# Patient Record
Sex: Male | Born: 1985 | Race: White | Hispanic: No | Marital: Single | Smoking: Never smoker
Health system: Southern US, Community
[De-identification: ages and names within clinical notes are randomized; demographics above are authoritative.]

## PROBLEM LIST (undated history)

## (undated) DIAGNOSIS — T4145XA Adverse effect of unspecified anesthetic, initial encounter: Secondary | ICD-10-CM

## (undated) DIAGNOSIS — Z8489 Family history of other specified conditions: Secondary | ICD-10-CM

## (undated) DIAGNOSIS — Z8619 Personal history of other infectious and parasitic diseases: Secondary | ICD-10-CM

## (undated) DIAGNOSIS — T8859XA Other complications of anesthesia, initial encounter: Secondary | ICD-10-CM

## (undated) DIAGNOSIS — F329 Major depressive disorder, single episode, unspecified: Secondary | ICD-10-CM

## (undated) DIAGNOSIS — F32A Depression, unspecified: Secondary | ICD-10-CM

## (undated) HISTORY — PX: APPENDECTOMY: SHX54

---
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
## 2008-03-08 ENCOUNTER — Emergency Department (HOSPITAL_COMMUNITY): Admission: EM | Admit: 2008-03-08 | Discharge: 2008-03-08 | Payer: Self-pay | Admitting: Emergency Medicine

## 2009-06-03 ENCOUNTER — Emergency Department (HOSPITAL_COMMUNITY): Admission: EM | Admit: 2009-06-03 | Discharge: 2009-06-03 | Payer: Self-pay | Admitting: Family Medicine

## 2010-12-19 ENCOUNTER — Inpatient Hospital Stay (INDEPENDENT_AMBULATORY_CARE_PROVIDER_SITE_OTHER)
Admission: RE | Admit: 2010-12-19 | Discharge: 2010-12-19 | Disposition: A | Discharge status: Home / Self Care | Payer: Managed Care, Other (non HMO) | Source: Ambulatory Visit | Attending: Emergency Medicine | Admitting: Emergency Medicine

## 2010-12-19 DIAGNOSIS — R6889 Other general symptoms and signs: Secondary | ICD-10-CM

## 2011-05-20 ENCOUNTER — Inpatient Hospital Stay (INDEPENDENT_AMBULATORY_CARE_PROVIDER_SITE_OTHER)
Admission: RE | Admit: 2011-05-20 | Discharge: 2011-05-20 | Disposition: A | Discharge status: Home / Self Care | Payer: Managed Care, Other (non HMO) | Source: Ambulatory Visit | Attending: Family Medicine | Admitting: Family Medicine

## 2011-05-20 DIAGNOSIS — B354 Tinea corporis: Secondary | ICD-10-CM

## 2013-12-18 ENCOUNTER — Emergency Department (HOSPITAL_COMMUNITY): Payer: 59 | Admitting: Anesthesiology

## 2013-12-18 ENCOUNTER — Encounter (HOSPITAL_COMMUNITY): Payer: 59 | Admitting: Anesthesiology

## 2013-12-18 ENCOUNTER — Encounter (HOSPITAL_COMMUNITY): Payer: Self-pay | Admitting: Emergency Medicine

## 2013-12-18 ENCOUNTER — Encounter (HOSPITAL_COMMUNITY): Admission: EM | Disposition: A | Discharge status: Home / Self Care | Payer: Self-pay | Source: Home / Self Care

## 2013-12-18 ENCOUNTER — Inpatient Hospital Stay (HOSPITAL_COMMUNITY)
Admission: EM | Admit: 2013-12-18 | Discharge: 2013-12-24 | DRG: 339 | Disposition: A | Discharge status: Home / Self Care | Payer: 59 | Attending: Surgery | Admitting: Surgery

## 2013-12-18 ENCOUNTER — Emergency Department (HOSPITAL_COMMUNITY): Payer: 59

## 2013-12-18 DIAGNOSIS — R1031 Right lower quadrant pain: Secondary | ICD-10-CM

## 2013-12-18 DIAGNOSIS — Z79899 Other long term (current) drug therapy: Secondary | ICD-10-CM

## 2013-12-18 DIAGNOSIS — L03319 Cellulitis of trunk, unspecified: Secondary | ICD-10-CM

## 2013-12-18 DIAGNOSIS — K35209 Acute appendicitis with generalized peritonitis, without abscess, unspecified as to perforation: Principal | ICD-10-CM | POA: Diagnosis present

## 2013-12-18 DIAGNOSIS — L02419 Cutaneous abscess of limb, unspecified: Secondary | ICD-10-CM | POA: Diagnosis present

## 2013-12-18 DIAGNOSIS — K3580 Unspecified acute appendicitis: Secondary | ICD-10-CM

## 2013-12-18 DIAGNOSIS — L02219 Cutaneous abscess of trunk, unspecified: Secondary | ICD-10-CM | POA: Diagnosis present

## 2013-12-18 DIAGNOSIS — L03119 Cellulitis of unspecified part of limb: Secondary | ICD-10-CM

## 2013-12-18 DIAGNOSIS — L0390 Cellulitis, unspecified: Secondary | ICD-10-CM

## 2013-12-18 DIAGNOSIS — K3532 Acute appendicitis with perforation and localized peritonitis, without abscess: Secondary | ICD-10-CM

## 2013-12-18 DIAGNOSIS — K352 Acute appendicitis with generalized peritonitis, without abscess: Principal | ICD-10-CM | POA: Diagnosis present

## 2013-12-18 DIAGNOSIS — R110 Nausea: Secondary | ICD-10-CM

## 2013-12-18 HISTORY — DX: Major depressive disorder, single episode, unspecified: F32.9

## 2013-12-18 HISTORY — DX: Adverse effect of unspecified anesthetic, initial encounter: T41.45XA

## 2013-12-18 HISTORY — DX: Other complications of anesthesia, initial encounter: T88.59XA

## 2013-12-18 HISTORY — DX: Depression, unspecified: F32.A

## 2013-12-18 HISTORY — PX: LAPAROSCOPIC APPENDECTOMY: SHX408

## 2013-12-18 HISTORY — DX: Family history of other specified conditions: Z84.89

## 2013-12-18 LAB — COMPREHENSIVE METABOLIC PANEL
ALT: 11 U/L (ref 0–53)
AST: 17 U/L (ref 0–37)
Albumin: 4.5 g/dL (ref 3.5–5.2)
Alkaline Phosphatase: 81 U/L (ref 39–117)
BUN: 8 mg/dL (ref 6–23)
CALCIUM: 9.8 mg/dL (ref 8.4–10.5)
CHLORIDE: 102 meq/L (ref 96–112)
CO2: 25 mEq/L (ref 19–32)
CREATININE: 0.86 mg/dL (ref 0.50–1.35)
Glucose, Bld: 81 mg/dL (ref 70–99)
POTASSIUM: 4 meq/L (ref 3.7–5.3)
Sodium: 138 mEq/L (ref 137–147)
Total Bilirubin: 1.4 mg/dL — ABNORMAL HIGH (ref 0.3–1.2)
Total Protein: 7.7 g/dL (ref 6.0–8.3)

## 2013-12-18 LAB — CBC WITH DIFFERENTIAL/PLATELET
BASOS ABS: 0 10*3/uL (ref 0.0–0.1)
Basophils Relative: 0 % (ref 0–1)
EOS ABS: 0 10*3/uL (ref 0.0–0.7)
Eosinophils Relative: 1 % (ref 0–5)
HEMATOCRIT: 44 % (ref 39.0–52.0)
HEMOGLOBIN: 15.9 g/dL (ref 13.0–17.0)
LYMPHS PCT: 14 % (ref 12–46)
Lymphs Abs: 1.1 10*3/uL (ref 0.7–4.0)
MCH: 31.5 pg (ref 26.0–34.0)
MCHC: 36.1 g/dL — AB (ref 30.0–36.0)
MCV: 87.3 fL (ref 78.0–100.0)
MONO ABS: 0.6 10*3/uL (ref 0.1–1.0)
MONOS PCT: 8 % (ref 3–12)
NEUTROS ABS: 6.2 10*3/uL (ref 1.7–7.7)
Neutrophils Relative %: 77 % (ref 43–77)
PLATELETS: 181 10*3/uL (ref 150–400)
RBC: 5.04 MIL/uL (ref 4.22–5.81)
RDW: 12.2 % (ref 11.5–15.5)
WBC: 7.9 10*3/uL (ref 4.0–10.5)

## 2013-12-18 LAB — URINALYSIS, ROUTINE W REFLEX MICROSCOPIC
BILIRUBIN URINE: NEGATIVE
GLUCOSE, UA: NEGATIVE mg/dL
HGB URINE DIPSTICK: NEGATIVE
Ketones, ur: NEGATIVE mg/dL
LEUKOCYTES UA: NEGATIVE
NITRITE: NEGATIVE
PROTEIN: NEGATIVE mg/dL
SPECIFIC GRAVITY, URINE: 1.022 (ref 1.005–1.030)
Urobilinogen, UA: 0.2 mg/dL (ref 0.0–1.0)
pH: 7.5 (ref 5.0–8.0)

## 2013-12-18 LAB — LIPASE, BLOOD: Lipase: 36 U/L (ref 11–59)

## 2013-12-18 SURGERY — APPENDECTOMY, LAPAROSCOPIC
Anesthesia: General

## 2013-12-18 MED ORDER — GLYCOPYRROLATE 0.2 MG/ML IJ SOLN
INTRAMUSCULAR | Status: DC | PRN
  Administered 2013-12-18: .5 mg via INTRAVENOUS

## 2013-12-18 MED ORDER — SUCCINYLCHOLINE CHLORIDE 20 MG/ML IJ SOLN
INTRAMUSCULAR | Status: AC
  Filled 2013-12-18: qty 1

## 2013-12-18 MED ORDER — OXYCODONE-ACETAMINOPHEN 5-325 MG PO TABS
1.0000 | ORAL_TABLET | ORAL | Status: DC | PRN
  Administered 2013-12-18 – 2013-12-22 (×17): 2 via ORAL
  Administered 2013-12-22: 1 via ORAL
  Administered 2013-12-22 – 2013-12-24 (×11): 2 via ORAL
  Filled 2013-12-18 (×6): qty 2
  Filled 2013-12-18: qty 1
  Filled 2013-12-18 (×15): qty 2
  Filled 2013-12-18: qty 1
  Filled 2013-12-18 (×8): qty 2

## 2013-12-18 MED ORDER — ROCURONIUM BROMIDE 50 MG/5ML IV SOLN
INTRAVENOUS | Status: AC
  Filled 2013-12-18: qty 1

## 2013-12-18 MED ORDER — ENOXAPARIN SODIUM 40 MG/0.4ML ~~LOC~~ SOLN
40.0000 mg | SUBCUTANEOUS | Status: DC
  Administered 2013-12-19 – 2013-12-24 (×6): 40 mg via SUBCUTANEOUS
  Filled 2013-12-18 (×11): qty 0.4

## 2013-12-18 MED ORDER — SODIUM CHLORIDE 0.9 % IR SOLN
Status: DC | PRN
  Administered 2013-12-18: 1000 mL

## 2013-12-18 MED ORDER — HYDROMORPHONE HCL PF 1 MG/ML IJ SOLN
0.2500 mg | INTRAMUSCULAR | Status: DC | PRN
  Administered 2013-12-18 (×4): 0.5 mg via INTRAVENOUS

## 2013-12-18 MED ORDER — FENTANYL CITRATE 0.05 MG/ML IJ SOLN
INTRAMUSCULAR | Status: DC | PRN
  Administered 2013-12-18: 50 ug via INTRAVENOUS
  Administered 2013-12-18 (×4): 100 ug via INTRAVENOUS
  Administered 2013-12-18: 50 ug via INTRAVENOUS

## 2013-12-18 MED ORDER — HYDROMORPHONE HCL PF 1 MG/ML IJ SOLN
1.0000 mg | INTRAMUSCULAR | Status: DC | PRN
  Administered 2013-12-18 – 2013-12-19 (×2): 1 mg via INTRAVENOUS
  Filled 2013-12-18 (×4): qty 1

## 2013-12-18 MED ORDER — ONDANSETRON HCL 4 MG/2ML IJ SOLN
4.0000 mg | Freq: Once | INTRAMUSCULAR | Status: AC
  Administered 2013-12-18: 4 mg via INTRAVENOUS
  Filled 2013-12-18: qty 2

## 2013-12-18 MED ORDER — VALACYCLOVIR HCL 500 MG PO TABS
500.0000 mg | ORAL_TABLET | ORAL | Status: DC
  Administered 2013-12-20 – 2013-12-24 (×2): 500 mg via ORAL
  Filled 2013-12-18 (×2): qty 1

## 2013-12-18 MED ORDER — ONDANSETRON HCL 4 MG/2ML IJ SOLN
INTRAMUSCULAR | Status: DC | PRN
  Administered 2013-12-18: 4 mg via INTRAVENOUS

## 2013-12-18 MED ORDER — PROPOFOL 10 MG/ML IV BOLUS
INTRAVENOUS | Status: AC
  Filled 2013-12-18: qty 20

## 2013-12-18 MED ORDER — SODIUM CHLORIDE 0.9 % IV SOLN
1.0000 g | INTRAVENOUS | Status: DC | PRN
  Administered 2013-12-18: 1 g via INTRAVENOUS

## 2013-12-18 MED ORDER — ERTAPENEM SODIUM 1 G IJ SOLR
1.0000 g | Freq: Once | INTRAMUSCULAR | Status: AC
  Filled 2013-12-18: qty 1

## 2013-12-18 MED ORDER — IOHEXOL 300 MG/ML  SOLN
100.0000 mL | Freq: Once | INTRAMUSCULAR | Status: AC | PRN
  Administered 2013-12-18: 100 mL via INTRAVENOUS

## 2013-12-18 MED ORDER — GLYCOPYRROLATE 0.2 MG/ML IJ SOLN
INTRAMUSCULAR | Status: AC
  Filled 2013-12-18: qty 1

## 2013-12-18 MED ORDER — MIDAZOLAM HCL 5 MG/5ML IJ SOLN
INTRAMUSCULAR | Status: DC | PRN
  Administered 2013-12-18: 2 mg via INTRAVENOUS

## 2013-12-18 MED ORDER — ONDANSETRON HCL 4 MG/2ML IJ SOLN
INTRAMUSCULAR | Status: AC
  Filled 2013-12-18: qty 2

## 2013-12-18 MED ORDER — FENTANYL CITRATE 0.05 MG/ML IJ SOLN
INTRAMUSCULAR | Status: AC
  Filled 2013-12-18: qty 5

## 2013-12-18 MED ORDER — SUCCINYLCHOLINE CHLORIDE 20 MG/ML IJ SOLN
INTRAMUSCULAR | Status: DC | PRN
  Administered 2013-12-18: 140 mg via INTRAVENOUS

## 2013-12-18 MED ORDER — BUPIVACAINE-EPINEPHRINE (PF) 0.25% -1:200000 IJ SOLN
INTRAMUSCULAR | Status: AC
  Filled 2013-12-18: qty 30

## 2013-12-18 MED ORDER — SODIUM CHLORIDE 0.9 % IV SOLN
1.0000 g | INTRAVENOUS | Status: DC
  Administered 2013-12-19 – 2013-12-21 (×3): 1 g via INTRAVENOUS
  Filled 2013-12-18 (×3): qty 1

## 2013-12-18 MED ORDER — ONDANSETRON HCL 4 MG/2ML IJ SOLN: 4.0000 mg | Freq: Four times a day (QID) | INTRAMUSCULAR | Status: DC | PRN

## 2013-12-18 MED ORDER — MIDAZOLAM HCL 2 MG/2ML IJ SOLN
INTRAMUSCULAR | Status: AC
Start: 2013-12-18 — End: 2013-12-18
  Administered 2013-12-18: 2 mg via INTRAVENOUS
  Filled 2013-12-18: qty 2

## 2013-12-18 MED ORDER — LIDOCAINE HCL (CARDIAC) 20 MG/ML IV SOLN
INTRAVENOUS | Status: AC
  Filled 2013-12-18: qty 5

## 2013-12-18 MED ORDER — ROCURONIUM BROMIDE 100 MG/10ML IV SOLN
INTRAVENOUS | Status: DC | PRN
  Administered 2013-12-18: 20 mg via INTRAVENOUS

## 2013-12-18 MED ORDER — BUPIVACAINE-EPINEPHRINE 0.25% -1:200000 IJ SOLN
INTRAMUSCULAR | Status: DC | PRN
  Administered 2013-12-18: 17 mL

## 2013-12-18 MED ORDER — ONDANSETRON HCL 4 MG PO TABS: 4.0000 mg | ORAL_TABLET | Freq: Four times a day (QID) | ORAL | Status: DC | PRN

## 2013-12-18 MED ORDER — OXYCODONE HCL 5 MG/5ML PO SOLN: 5.0000 mg | Freq: Once | ORAL | Status: DC | PRN

## 2013-12-18 MED ORDER — PROPOFOL 10 MG/ML IV BOLUS
INTRAVENOUS | Status: DC | PRN
  Administered 2013-12-18: 200 mg via INTRAVENOUS

## 2013-12-18 MED ORDER — MIDAZOLAM HCL 2 MG/2ML IJ SOLN
INTRAMUSCULAR | Status: AC
  Filled 2013-12-18: qty 2

## 2013-12-18 MED ORDER — OXYCODONE-ACETAMINOPHEN 5-325 MG PO TABS
ORAL_TABLET | ORAL | Status: AC
  Administered 2013-12-18: 2 via ORAL
  Filled 2013-12-18: qty 2

## 2013-12-18 MED ORDER — HYDROMORPHONE HCL PF 1 MG/ML IJ SOLN
INTRAMUSCULAR | Status: AC
  Administered 2013-12-18: 0.5 mg via INTRAVENOUS
  Filled 2013-12-18: qty 2

## 2013-12-18 MED ORDER — FENTANYL CITRATE 0.05 MG/ML IJ SOLN
50.0000 ug | Freq: Once | INTRAMUSCULAR | Status: AC
Start: 2013-12-18 — End: 2013-12-18
  Administered 2013-12-18: 50 ug via INTRAVENOUS
  Filled 2013-12-18: qty 2

## 2013-12-18 MED ORDER — MIDAZOLAM HCL 2 MG/2ML IJ SOLN
2.0000 mg | Freq: Once | INTRAMUSCULAR | Status: AC
  Administered 2013-12-18: 2 mg via INTRAVENOUS

## 2013-12-18 MED ORDER — FENTANYL CITRATE 0.05 MG/ML IJ SOLN
50.0000 ug | Freq: Once | INTRAMUSCULAR | Status: AC
  Administered 2013-12-18: 50 ug via INTRAVENOUS
  Filled 2013-12-18: qty 2

## 2013-12-18 MED ORDER — HYDROMORPHONE HCL PF 1 MG/ML IJ SOLN
1.0000 mg | Freq: Once | INTRAMUSCULAR | Status: AC
  Administered 2013-12-18: 1 mg via INTRAVENOUS
  Filled 2013-12-18: qty 1

## 2013-12-18 MED ORDER — NEOSTIGMINE METHYLSULFATE 10 MG/10ML IV SOLN
INTRAVENOUS | Status: AC
  Filled 2013-12-18: qty 1

## 2013-12-18 MED ORDER — NEOSTIGMINE METHYLSULFATE 10 MG/10ML IV SOLN
INTRAVENOUS | Status: DC | PRN
  Administered 2013-12-18: 3.5 mg via INTRAVENOUS

## 2013-12-18 MED ORDER — LACTATED RINGERS IV SOLN
INTRAVENOUS | Status: DC | PRN
  Administered 2013-12-18 (×2): via INTRAVENOUS

## 2013-12-18 MED ORDER — OXYCODONE HCL 5 MG PO TABS: 5.0000 mg | ORAL_TABLET | Freq: Once | ORAL | Status: DC | PRN

## 2013-12-18 MED ORDER — LIDOCAINE HCL (CARDIAC) 20 MG/ML IV SOLN
INTRAVENOUS | Status: DC | PRN
  Administered 2013-12-18: 50 mg via INTRAVENOUS

## 2013-12-18 MED ORDER — IOHEXOL 300 MG/ML  SOLN
25.0000 mL | INTRAMUSCULAR | Status: AC | PRN
  Administered 2013-12-18: 25 mL via ORAL

## 2013-12-18 MED ORDER — SODIUM CHLORIDE 0.9 % IV SOLN
3.0000 g | INTRAVENOUS | Status: AC
  Administered 2013-12-18: 3 g via INTRAVENOUS
  Filled 2013-12-18: qty 3

## 2013-12-18 SURGICAL SUPPLY — 44 items
APPLIER CLIP ROT 10 11.4 M/L (STAPLE)
BLADE SURG ROTATE 9660 (MISCELLANEOUS) IMPLANT
CANISTER SUCTION 2500CC (MISCELLANEOUS) ×3 IMPLANT
CHLORAPREP W/TINT 26ML (MISCELLANEOUS) ×3 IMPLANT
CLIP APPLIE ROT 10 11.4 M/L (STAPLE) IMPLANT
CLOSURE STERI-STRIP 1/2X4 (GAUZE/BANDAGES/DRESSINGS) ×1
CLSR STERI-STRIP ANTIMIC 1/2X4 (GAUZE/BANDAGES/DRESSINGS) ×2 IMPLANT
COVER SURGICAL LIGHT HANDLE (MISCELLANEOUS) ×3 IMPLANT
CUTTER LINEAR ENDO 35 ETS (STAPLE) ×3 IMPLANT
CUTTER LINEAR ENDO 35 ETS TH (STAPLE) IMPLANT
DECANTER SPIKE VIAL GLASS SM (MISCELLANEOUS) IMPLANT
DERMABOND ADVANCED (GAUZE/BANDAGES/DRESSINGS) ×2
DERMABOND ADVANCED .7 DNX12 (GAUZE/BANDAGES/DRESSINGS) ×1 IMPLANT
DRAPE UTILITY 15X26 W/TAPE STR (DRAPE) ×6 IMPLANT
DRSG TEGADERM 2-3/8X2-3/4 SM (GAUZE/BANDAGES/DRESSINGS) ×3 IMPLANT
ELECT REM PT RETURN 9FT ADLT (ELECTROSURGICAL) ×3
ELECTRODE REM PT RTRN 9FT ADLT (ELECTROSURGICAL) ×1 IMPLANT
ENDOLOOP SUT PDS II  0 18 (SUTURE)
ENDOLOOP SUT PDS II 0 18 (SUTURE) IMPLANT
FLUID NSS /IRRIG 3000 ML XXX (IV SOLUTION) ×3 IMPLANT
GLOVE BIOGEL PI IND STRL 8 (GLOVE) ×1 IMPLANT
GLOVE BIOGEL PI INDICATOR 8 (GLOVE) ×2
GLOVE ECLIPSE 7.5 STRL STRAW (GLOVE) ×3 IMPLANT
GOWN STRL REUS W/ TWL LRG LVL3 (GOWN DISPOSABLE) ×2 IMPLANT
GOWN STRL REUS W/TWL LRG LVL3 (GOWN DISPOSABLE) ×4
KIT BASIN OR (CUSTOM PROCEDURE TRAY) ×3 IMPLANT
KIT ROOM TURNOVER OR (KITS) ×3 IMPLANT
NS IRRIG 1000ML POUR BTL (IV SOLUTION) ×3 IMPLANT
PAD ARMBOARD 7.5X6 YLW CONV (MISCELLANEOUS) ×6 IMPLANT
PENCIL BUTTON HOLSTER BLD 10FT (ELECTRODE) IMPLANT
POUCH SPECIMEN RETRIEVAL 10MM (ENDOMECHANICALS) ×6 IMPLANT
RELOAD /EVU35 (ENDOMECHANICALS) IMPLANT
RELOAD CUTTER ETS 35MM STAND (ENDOMECHANICALS) ×3 IMPLANT
SET IRRIG TUBING LAPAROSCOPIC (IRRIGATION / IRRIGATOR) ×3 IMPLANT
SPECIMEN JAR SMALL (MISCELLANEOUS) ×3 IMPLANT
SUT MNCRL AB 4-0 PS2 18 (SUTURE) ×3 IMPLANT
TOWEL OR 17X24 6PK STRL BLUE (TOWEL DISPOSABLE) ×3 IMPLANT
TOWEL OR 17X26 10 PK STRL BLUE (TOWEL DISPOSABLE) ×3 IMPLANT
TRAY FOLEY CATH 16FR SILVER (SET/KITS/TRAYS/PACK) ×3 IMPLANT
TRAY LAPAROSCOPIC (CUSTOM PROCEDURE TRAY) ×3 IMPLANT
TROCAR XCEL 12X100 BLDLESS (ENDOMECHANICALS) ×3 IMPLANT
TROCAR XCEL BLUNT TIP 100MML (ENDOMECHANICALS) ×3 IMPLANT
TROCAR XCEL NON-BLD 5MMX100MML (ENDOMECHANICALS) ×3 IMPLANT
WATER STERILE IRR 1000ML POUR (IV SOLUTION) IMPLANT

## 2013-12-18 NOTE — ED Notes (Signed)
PT reports increase in pain. PA made aware.

## 2013-12-18 NOTE — ED Provider Notes (Signed)
CSN: [id number]     Arrival date & time 12/18/13  1132 History   First MD Initiated Contact with Patient 12/18/13 1153     Chief Complaint  Patient presents with  . Abdominal Pain     (Consider location/radiation/quality/duration/timing/severity/associated sxs/prior Treatment) HPI Dylan Parsons is a 28 y.o. male who presents to emergency department complaining of abdominal pain. Patient states he has had abdominal pain for 5 days now. It started as mild pain periumbilical. States now it is radiating up and down the right side of the abdomen. States he has had frequent soft stools that have been pale in color. He admits to nausea, denies any vomiting. He has had chills however denies any fever. He took ibuprofen for his pain with no improvement. He states that he is a daily alcohol drinker but states he only drinks one beer a day after work. 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 He states that his pain became much worse today, he states that he couldn't stand up straight due to pain, movement and walking makes pain worse. He went to urgent care where they examined him and sent him to emergency department for possible appendicitis.  History reviewed. No pertinent past medical history. History reviewed. No pertinent past surgical history. No family history on file. History  Substance Use Topics  . Smoking status: Never Smoker   . Smokeless tobacco: Not on file  . Alcohol Use: Yes     Comment: socially     Review of Systems  Constitutional: Positive for chills. Negative for fever.  Respiratory: Negative for cough, chest tightness and shortness of breath.   Cardiovascular: Negative for chest pain, palpitations and leg swelling.  Gastrointestinal: Positive for nausea and abdominal pain. Negative for vomiting, diarrhea and abdominal distention.  Genitourinary: Negative for dysuria, urgency, frequency and hematuria.  Musculoskeletal: Negative  for arthralgias, myalgias, neck pain and neck stiffness.  Skin: Negative for rash.  Allergic/Immunologic: Negative for immunocompromised state.  Neurological: Negative for dizziness, weakness, light-headedness, numbness and headaches.      Allergies  Review of patient's allergies indicates no known allergies.  Home Medications   Prior to Admission medications   Not on File   BP 127/87  Pulse 94  Temp(Src) 97.9 F (36.6 C) (Oral)  Resp 18  Ht 6' (1.829 m)  Wt 175 lb (79.379 kg)  BMI 23.73 kg/m2  SpO2 100% Physical Exam  Nursing note and vitals reviewed. Constitutional: He is oriented to person, place, and time. He appears well-developed and well-nourished. No distress.  HENT:  Head: Normocephalic and atraumatic.  Eyes: Conjunctivae are normal.  Neck: Neck supple.  Cardiovascular: Normal rate, regular rhythm and normal heart sounds.   Pulmonary/Chest: Effort normal. No respiratory distress. He has no wheezes. He has no rales.  Abdominal: Soft. Bowel sounds are normal. He exhibits no distension. There is tenderness. There is no rebound.  RLQ tenderness, guarding, LLQ tenderness  Musculoskeletal: He exhibits no edema.  Neurological: He is alert and oriented to person, place, and time.  Skin: Skin is warm and dry.    ED Course  Procedures (including critical care time) Labs Review Labs Reviewed  CBC WITH DIFFERENTIAL - Abnormal; Notable for the following:    MCHC 36.1 (*)    All other components within normal limits  COMPREHENSIVE METABOLIC PANEL - Abnormal; Notable for the following:    Total Bilirubin 1.4 (*)    All other components within normal limits  LIPASE, BLOOD  URINALYSIS, ROUTINE W REFLEX MICROSCOPIC    Imaging Review Ct Abdomen Pelvis W Contrast  12/18/2013   CLINICAL DATA:  Mid abdominal pain  EXAM: CT ABDOMEN AND PELVIS WITH CONTRAST  TECHNIQUE: Multidetector CT imaging of the abdomen and pelvis was performed using the standard protocol following  bolus administration of intravenous contrast.  CONTRAST:  OMNIPAQUE IOHEXOL 300 MG/ML SOLN, 90mL OMNIPAQUE IOHEXOL 300 MG/ML SOLN  COMPARISON:  None.  FINDINGS: The lung bases are free of acute infiltrate or sizable effusion.  The liver, gallbladder, spleen, adrenal glands and pancreas are all normal in their CT appearance. Kidneys are well visualized bilaterally. No renal calculi or urinary tract obstructive changes are seen.  In the right lower quadrant there is considerable inflammatory change surrounding the dilated appendix. Appendicolith is noted within the appendix. It is maximally dilated to 2 cm. No free pelvic fluid is noted. The bladder is well distended. No acute bony abnormality is seen.  IMPRESSION: Changes consistent with acute appendicitis   Electronically Signed   By: Alcide Clever M.D.   On: 12/18/2013 13:19     EKG Interpretation None      MDM   Final diagnoses:  Acute appendicitis    Pt with diffuse abdominal pain with significant tenderness in RLQ. Pt already received fentanyl per RN protochol and he is feeling much better. Will get labs, UA, CT abd/pelvis  1:39 PM Labs normal. CT showing changes consistent with acute appendicitis. Spoke with Dr. Lindie Spruce, will come see pt. Pt notified of findings. Pain is currently controlled.   Filed Vitals:   12/18/13 1200 12/18/13 1230 12/18/13 1315 12/18/13 1338  BP: 138/91 146/78 122/65 118/62  Pulse: 88 77 87 88  Temp:      TempSrc:      Resp: 18 18 18 18   Height:      Weight:      SpO2: 95% 100% 99% 98%     Lottie Mussel, PA-C 12/19/13 2047

## 2013-12-18 NOTE — ED Notes (Signed)
Dr. Lindie Spruce at bedside; consent for surgery signed.

## 2013-12-18 NOTE — ED Notes (Signed)
Patient returned from CT

## 2013-12-18 NOTE — Anesthesia Preprocedure Evaluation (Addendum)
Anesthesia Evaluation  Patient identified by MRN, date of birth, ID band Patient awake    Reviewed: Allergy & Precautions, H&P , NPO status , Patient's Chart, lab work & pertinent test results  History of Anesthesia Complications Negative for: history of anesthetic complications  Airway Mallampati: I TM Distance: >3 FB Neck ROM: Full    Dental no notable dental hx. (+) Teeth Intact, Dental Advisory Given   Pulmonary neg pulmonary ROS,    Pulmonary exam normal       Cardiovascular negative cardio ROS      Neuro/Psych negative neurological ROS  negative psych ROS   GI/Hepatic negative GI ROS, Neg liver ROS,   Endo/Other  negative endocrine ROS  Renal/GU negative Renal ROS  negative genitourinary   Musculoskeletal   Abdominal   Peds  Hematology negative hematology ROS (+)   Anesthesia Other Findings   Reproductive/Obstetrics negative OB ROS                         Anesthesia Physical Anesthesia Plan  ASA: I and emergent  Anesthesia Plan: General   Post-op Pain Management:    Induction: Intravenous, Rapid sequence and Cricoid pressure planned  Airway Management Planned: Oral ETT  Additional Equipment:   Intra-op Plan:   Post-operative Plan: Extubation in OR  Informed Consent: I have reviewed the patients History and Physical, chart, labs and discussed the procedure including the risks, benefits and alternatives for the proposed anesthesia with the patient or authorized representative who has indicated his/her understanding and acceptance.   Dental advisory given  Plan Discussed with: CRNA, Anesthesiologist and Surgeon  Anesthesia Plan Comments:        Anesthesia Quick Evaluation

## 2013-12-18 NOTE — Anesthesia Postprocedure Evaluation (Signed)
  Anesthesia Post-op Note  Patient: Dylan Parsons  Procedure(s) Performed: Procedure(s): APPENDECTOMY LAPAROSCOPIC (N/A)  Patient Location: PACU  Anesthesia Type:General  Level of Consciousness: awake and alert   Airway and Oxygen Therapy: Patient Spontanous Breathing  Post-op Pain: mild  Post-op Assessment: Post-op Vital signs reviewed, Patient's Cardiovascular Status Stable and Respiratory Function Stable  Post-op Vital Signs: Reviewed  Filed Vitals:   12/18/13 1830  BP:   Pulse:   Temp: 37.2 C  Resp:     Complications: No apparent anesthesia complications

## 2013-12-18 NOTE — Progress Notes (Signed)
1 bag of belongings taken to PACU. 

## 2013-12-18 NOTE — Anesthesia Procedure Notes (Signed)
Procedure Name: Intubation Date/Time: 12/18/2013 3:54 PM Performed by: Estie Sproule S Pre-anesthesia Checklist: Patient identified, Timeout performed, Emergency Drugs available, Suction available and Patient being monitored Patient Re-evaluated:Patient Re-evaluated prior to inductionOxygen Delivery Method: Circle system utilized Preoxygenation: Pre-oxygenation with 100% oxygen Intubation Type: IV induction, Rapid sequence and Cricoid Pressure applied Ventilation: Mask ventilation without difficulty Laryngoscope Size: Mac and 4 Grade View: Grade I Tube type: Oral Tube size: 7.5 mm Number of attempts: 1 Airway Equipment and Method: Stylet Placement Confirmation: ETT inserted through vocal cords under direct vision,  positive ETCO2 and breath sounds checked- equal and bilateral Secured at: 22 cm Tube secured with: Tape Dental Injury: Teeth and Oropharynx as per pre-operative assessment

## 2013-12-18 NOTE — ED Notes (Signed)
Patient finished oral contrast; CT still at bedside and is aware.

## 2013-12-18 NOTE — ED Notes (Signed)
Patient transported to CT 

## 2013-12-18 NOTE — Transfer of Care (Signed)
Immediate Anesthesia Transfer of Care Note  Patient: Dylan Parsons  Procedure(s) Performed: Procedure(s): APPENDECTOMY LAPAROSCOPIC (N/A)  Patient Location: PACU  Anesthesia Type:General  Level of Consciousness: awake, alert  and oriented  Airway & Oxygen Therapy: Patient Spontanous Breathing and Patient connected to nasal cannula oxygen  Post-op Assessment: Report given to PACU RN and Post -op Vital signs reviewed and stable  Post vital signs: Reviewed and stable  Complications: No apparent anesthesia complications

## 2013-12-18 NOTE — H&P (Signed)
Dylan Parsons is an 28 y.o. male.   Chief Complaint: Abdominal pain and acute appendicitis. HPI: Patient actually had this pain one year ago, more severe, but went away on its own.  This last episode started Tuesday, worsened by Thursday, came to Urgent Care today and was ssent to the ED.  Dx of acute appendicitis clinically and radiologically.  History reviewed. No pertinent past medical history.  History reviewed. No pertinent past surgical history.  No family history on file. Social History:  reports that he has never smoked. He does not have any smokeless tobacco history on file. He reports that he drinks alcohol. He reports that he does not use illicit drugs.  Allergies: No Known Allergies   (Not in a hospital admission)  Results for orders placed during the hospital encounter of 12/18/13 (from the past 48 hour(s))  CBC WITH DIFFERENTIAL     Status: Abnormal   Collection Time    12/18/13 11:48 AM      Result Value Ref Range   WBC 7.9  4.0 - 10.5 K/uL   RBC 5.04  4.22 - 5.81 MIL/uL   Hemoglobin 15.9  13.0 - 17.0 g/dL   HCT 44.0  39.0 - 52.0 %   MCV 87.3  78.0 - 100.0 fL   MCH 31.5  26.0 - 34.0 pg   MCHC 36.1 (*) 30.0 - 36.0 g/dL   RDW 12.2  11.5 - 15.5 %   Platelets 181  150 - 400 K/uL   Neutrophils Relative % 77  43 - 77 %   Neutro Abs 6.2  1.7 - 7.7 K/uL   Lymphocytes Relative 14  12 - 46 %   Lymphs Abs 1.1  0.7 - 4.0 K/uL   Monocytes Relative 8  3 - 12 %   Monocytes Absolute 0.6  0.1 - 1.0 K/uL   Eosinophils Relative 1  0 - 5 %   Eosinophils Absolute 0.0  0.0 - 0.7 K/uL   Basophils Relative 0  0 - 1 %   Basophils Absolute 0.0  0.0 - 0.1 K/uL  COMPREHENSIVE METABOLIC PANEL     Status: Abnormal   Collection Time    12/18/13 11:48 AM      Result Value Ref Range   Sodium 138  137 - 147 mEq/L   Potassium 4.0  3.7 - 5.3 mEq/L   Chloride 102  96 - 112 mEq/L   CO2 25  19 - 32 mEq/L   Glucose, Bld 81  70 - 99 mg/dL   BUN 8  6 - 23 mg/dL   Creatinine, Ser 0.86  0.50 -  1.35 mg/dL   Calcium 9.8  8.4 - 10.5 mg/dL   Total Protein 7.7  6.0 - 8.3 g/dL   Albumin 4.5  3.5 - 5.2 g/dL   AST 17  0 - 37 U/L   ALT 11  0 - 53 U/L   Alkaline Phosphatase 81  39 - 117 U/L   Total Bilirubin 1.4 (*) 0.3 - 1.2 mg/dL   GFR calc non Af Amer >90  >90 mL/min   GFR calc Af Amer >90  >90 mL/min   Comment: (NOTE)     The eGFR has been calculated using the CKD EPI equation.     This calculation has not been validated in all clinical situations.     eGFR's persistently <90 mL/min signify possible Chronic Kidney     Disease.  LIPASE, BLOOD     Status: None   Collection  Time    12/18/13 11:48 AM      Result Value Ref Range   Lipase 36  11 - 59 U/L  URINALYSIS, ROUTINE W REFLEX MICROSCOPIC     Status: None   Collection Time    12/18/13 12:17 PM      Result Value Ref Range   Color, Urine YELLOW  YELLOW   APPearance CLEAR  CLEAR   Specific Gravity, Urine 1.022  1.005 - 1.030   pH 7.5  5.0 - 8.0   Glucose, UA NEGATIVE  NEGATIVE mg/dL   Hgb urine dipstick NEGATIVE  NEGATIVE   Bilirubin Urine NEGATIVE  NEGATIVE   Ketones, ur NEGATIVE  NEGATIVE mg/dL   Protein, ur NEGATIVE  NEGATIVE mg/dL   Urobilinogen, UA 0.2  0.0 - 1.0 mg/dL   Nitrite NEGATIVE  NEGATIVE   Leukocytes, UA NEGATIVE  NEGATIVE   Comment: MICROSCOPIC NOT DONE ON URINES WITH NEGATIVE PROTEIN, BLOOD, LEUKOCYTES, NITRITE, OR GLUCOSE <1000 mg/dL.   Ct Abdomen Pelvis W Contrast  12/18/2013   CLINICAL DATA:  Mid abdominal pain  EXAM: CT ABDOMEN AND PELVIS WITH CONTRAST  TECHNIQUE: Multidetector CT imaging of the abdomen and pelvis was performed using the standard protocol following bolus administration of intravenous contrast.  CONTRAST:  150m OMNIPAQUE IOHEXOL 300 MG/ML SOLN, 227mOMNIPAQUE IOHEXOL 300 MG/ML SOLN  COMPARISON:  None.  FINDINGS: The lung bases are free of acute infiltrate or sizable effusion.  The liver, gallbladder, spleen, adrenal glands and pancreas are all normal in their CT appearance. Kidneys are  well visualized bilaterally. No renal calculi or urinary tract obstructive changes are seen.  In the right lower quadrant there is considerable inflammatory change surrounding the dilated appendix. Appendicolith is noted within the appendix. It is maximally dilated to 2 cm. No free pelvic fluid is noted. The bladder is well distended. No acute bony abnormality is seen.  IMPRESSION: Changes consistent with acute appendicitis   Electronically Signed   By: MaInez Catalina.D.   On: 12/18/2013 13:19    Review of Systems  Constitutional: Positive for chills. Negative for fever.  Gastrointestinal: Positive for nausea and abdominal pain.  All other systems reviewed and are negative.   Blood pressure 126/70, pulse 77, temperature 97.9 F (36.6 C), temperature source Oral, resp. rate 18, height 6' (1.829 m), weight 79.379 kg (175 lb), SpO2 97.00%. Physical Exam  Constitutional: He is oriented to person, place, and time. He appears well-developed and well-nourished.  HENT:  Head: Normocephalic and atraumatic.  Right Ear: External ear normal.  Left Ear: External ear normal.  Eyes: Conjunctivae are normal. Pupils are equal, round, and reactive to light.  Neck: Normal range of motion.  Cardiovascular: Normal rate, regular rhythm and normal heart sounds.   Respiratory: Effort normal and breath sounds normal.  GI: Soft. Normal appearance. There is tenderness in the right lower quadrant and periumbilical area. There is rebound, guarding and tenderness at McBurney's point.  Musculoskeletal: Normal range of motion.  Neurological: He is alert and oriented to person, place, and time.  Skin: Skin is warm and dry.  Psychiatric: He has a normal mood and affect. His behavior is normal. Judgment and thought content normal.     Assessment/Plan Acute appendicitis  Unasyn Preop OR for lap appy.   Risks and benefits explained to the patient.  Will take to the OR ASAP.  JaGwenyth Ober/30/2015, 2:27 PM

## 2013-12-18 NOTE — ED Notes (Signed)
Pt reports mid abdominal pain x 5 days with nausea. Denies diarrhea, or emesis. States pain worse with movement. Denies urinary symptoms.

## 2013-12-18 NOTE — ED Notes (Signed)
Family has not arrived yet; security at bedside locking up valuables consisting of phone, keys, and wallet.

## 2013-12-18 NOTE — Op Note (Signed)
OPERATIVE REPORT  DATE OF OPERATION: 12/18/2013  PATIENT:  Dylan Parsons  28 y.o. male  PRE-OPERATIVE DIAGNOSIS:  Acute appendicitis  POST-OPERATIVE DIAGNOSIS:  Acute appendicitis with rupture  PROCEDURE:  Procedure(s): APPENDECTOMY LAPAROSCOPIC  SURGEON:  Surgeon(s): Cherylynn Ridges, MD  ASSISTANT: None  ANESTHESIA:   general  EBL: <30 ml  BLOOD ADMINISTERED: none  DRAINS: none   SPECIMEN:  Source of Specimen:  Appendix  COUNTS CORRECT:  YES  PROCEDURE DETAILS: The patient was taken to the operating room and placed on the table in the supine position. After an adequate general endotracheal anesthetic was administered he was prepped and draped in the usual sterile manner exposing its entire abdomen.  A proper timeout was performed identifying the patient and the procedure to be performed. A supraumbilical midline incision was made using a #15 blade. This took [place] down to the midline fascia which was incised with a #15 blade. We bluntly dissected down into the peritoneal cavity then passed a pursestring suture of 0 Vicryl around the fascial opening. This secured in a Hassan cannula which was subsequently passed into the peritoneal cavity.  Carbon dioxide gas was insufflated into the peritoneal cavity up to a maximal intra-abdominal pressure of 15 mm mercury through the [facility] cannula. We subsequently passed a right upper quadrant 5 mm cannula and a left low quadrant 12 mm cannula under direct vision. The patient was placed in Trendelenburg position and the left eye was tilted down.  Be acutely inflamed and markedly distended appendix could be noted partially walled off by the terminal ileum and the mesentery in the right low quadrant. We bluntly dissected the appendix away from the terminal ileum and as we were doing so it ruptured spilling purulent contents into the right low quadrant. This was subsequently aspirated away with over 4 L of warm saline irrigation. However in the  process we had to mobilize the appendix up towards the right upper quadrant and noted to be at the base. In the process the midportion of the ruptured appendix involves from the proximal portion we subsequently removed bed independently. The mesentery was taken with an Endo GIA stapler.  We subsequently retrieved the distal portion of the tendon using an Endo Catch bag we then had to go back in and dissect out the base of the appendix and the complete proximal half of the starting at the base of the cecum and isolate and not the mesoappendix using a Harmonic Scalpel.  We came across the base of the cecum again using a blue cartridge Endo GIA. We subsequently removed the remainder of the appendix from the cannula site in the left low quadrant. We then obtain adequate hemostasis using electrocautery and irrigation brought to ascertain whether not there was any leakage or damage. There was no damage noted and no leakage. We aspirated all fluid and gas from above the liver and around the peritoneal cavity using the aspirator.  The supraumbilical fascial site was closed using the pursestring suture which was in place. We subsequently again aspirated all fluid and gas are remove all cannulas. We injected quarter percent Marcaine at all sites. The skin was then closed using a running subcuticular stitch of 4-0 Monocryl, then Dermabond, the Steri-Strips and Tegaderm.  All needle counts, sponge counts, and instrument counts were correct.  PATIENT DISPOSITION:  PACU - hemodynamically stable.   Cherylynn Ridges 5/30/20155:29 PM

## 2013-12-18 NOTE — Progress Notes (Signed)
Pt is extremely anxious and is having significant pain. Anesthesia at bedside. Versed given as ordered. Pt is now resting quietly and says pain is "much better"

## 2013-12-19 NOTE — Progress Notes (Signed)
Patient ID: Dylan Parsons, male   DOB: 03-29-1986, 28 y.o.   MRN: [id number] [facility] Progress Note:   1 Day Post-Op  Subjective: Mental status is clear; sitting up in chair in the bathroom.  No flatus yet Objective: Vital signs in last 24 hours: Temp:  [97.9 F (36.6 C)-99.4 F (37.4 C)] 98.2 F (36.8 C) (05/31 0510) Pulse Rate:  [73-117] 107 (05/31 0510) Resp:  [12-23] 15 (05/31 0510) BP: (118-176)/(62-99) 118/64 mmHg (05/31 0510) SpO2:  [93 %-100 %] 98 % (05/31 0510) Weight:  [175 lb (79.379 kg)] 175 lb (79.379 kg) (05/30 1140)  Intake/Output from previous day: 05/30 0701 - 05/31 0700 In: 1650 [I.V.:1650] Out: 125 [Urine:100; Blood:25] Intake/Output this shift:    Physical Exam: Work of breathing is not labored.  Incisions clean and covered with Dermabond.    Lab Results:  Results for orders placed during the hospital encounter of 12/18/13 (from the past 48 hour(s))  CBC WITH DIFFERENTIAL     Status: Abnormal   Collection Time    12/18/13 11:48 AM      Result Value Ref Range   WBC 7.9  4.0 - 10.5 K/uL   RBC 5.04  4.22 - 5.81 MIL/uL   Hemoglobin 15.9  13.0 - 17.0 g/dL   HCT 44.0  39.0 - 52.0 %   MCV 87.3  78.0 - 100.0 fL   MCH 31.5  26.0 - 34.0 pg   MCHC 36.1 (*) 30.0 - 36.0 g/dL   RDW 12.2  11.5 - 15.5 %   Platelets 181  150 - 400 K/uL   Neutrophils Relative % 77  43 - 77 %   Neutro Abs 6.2  1.7 - 7.7 K/uL   Lymphocytes Relative 14  12 - 46 %   Lymphs Abs 1.1  0.7 - 4.0 K/uL   Monocytes Relative 8  3 - 12 %   Monocytes Absolute 0.6  0.1 - 1.0 K/uL   Eosinophils Relative 1  0 - 5 %   Eosinophils Absolute 0.0  0.0 - 0.7 K/uL   Basophils Relative 0  0 - 1 %   Basophils Absolute 0.0  0.0 - 0.1 K/uL  COMPREHENSIVE METABOLIC PANEL     Status: Abnormal   Collection Time    12/18/13 11:48 AM      Result Value Ref Range   Sodium 138  137 - 147 mEq/L   Potassium 4.0  3.7 - 5.3 mEq/L   Chloride 102  96 - 112 mEq/L   CO2 25  19 - 32 mEq/L   Glucose, Bld  81  70 - 99 mg/dL   BUN 8  6 - 23 mg/dL   Creatinine, Ser 0.86  0.50 - 1.35 mg/dL   Calcium 9.8  8.4 - 10.5 mg/dL   Total Protein 7.7  6.0 - 8.3 g/dL   Albumin 4.5  3.5 - 5.2 g/dL   AST 17  0 - 37 U/L   ALT 11  0 - 53 U/L   Alkaline Phosphatase 81  39 - 117 U/L   Total Bilirubin 1.4 (*) 0.3 - 1.2 mg/dL   GFR calc non Af Amer >90  >90 mL/min   GFR calc Af Amer >90  >90 mL/min   Comment: (NOTE)     The eGFR has been calculated using the CKD EPI equation.     This calculation has not been validated in all clinical situations.     eGFR's persistently <90 mL/min signify possible Chronic  Kidney     Disease.  LIPASE, BLOOD     Status: None   Collection Time    12/18/13 11:48 AM      Result Value Ref Range   Lipase 36  11 - 59 U/L  URINALYSIS, ROUTINE W REFLEX MICROSCOPIC     Status: None   Collection Time    12/18/13 12:17 PM      Result Value Ref Range   Color, Urine YELLOW  YELLOW   APPearance CLEAR  CLEAR   Specific Gravity, Urine 1.022  1.005 - 1.030   pH 7.5  5.0 - 8.0   Glucose, UA NEGATIVE  NEGATIVE mg/dL   Hgb urine dipstick NEGATIVE  NEGATIVE   Bilirubin Urine NEGATIVE  NEGATIVE   Ketones, ur NEGATIVE  NEGATIVE mg/dL   Protein, ur NEGATIVE  NEGATIVE mg/dL   Urobilinogen, UA 0.2  0.0 - 1.0 mg/dL   Nitrite NEGATIVE  NEGATIVE   Leukocytes, UA NEGATIVE  NEGATIVE   Comment: MICROSCOPIC NOT DONE ON URINES WITH NEGATIVE PROTEIN, BLOOD, LEUKOCYTES, NITRITE, OR GLUCOSE <1000 mg/dL.    Radiology/Results: Ct Abdomen Pelvis W Contrast  12/18/2013   CLINICAL DATA:  Mid abdominal pain  EXAM: CT ABDOMEN AND PELVIS WITH CONTRAST  TECHNIQUE: Multidetector CT imaging of the abdomen and pelvis was performed using the standard protocol following bolus administration of intravenous contrast.  CONTRAST:  148m OMNIPAQUE IOHEXOL 300 MG/ML SOLN, 295mOMNIPAQUE IOHEXOL 300 MG/ML SOLN  COMPARISON:  None.  FINDINGS: The lung bases are free of acute infiltrate or sizable effusion.  The liver,  gallbladder, spleen, adrenal glands and pancreas are all normal in their CT appearance. Kidneys are well visualized bilaterally. No renal calculi or urinary tract obstructive changes are seen.  In the right lower quadrant there is considerable inflammatory change surrounding the dilated appendix. Appendicolith is noted within the appendix. It is maximally dilated to 2 cm. No free pelvic fluid is noted. The bladder is well distended. No acute bony abnormality is seen.  IMPRESSION: Changes consistent with acute appendicitis   Electronically Signed   By: MaInez Catalina.D.   On: 12/18/2013 13:19    Anti-infectives: Anti-infectives   Start     Dose/Rate Route Frequency Ordered Stop   12/20/13 1000  valACYclovir (VALTREX) tablet 500 mg     500 mg Oral Once per day on Mon Fri 12/18/13 1909     12/19/13 1800  ertapenem (INVANZ) 1 g in sodium chloride 0.9 % 50 mL IVPB     1 g 100 mL/hr over 30 Minutes Intravenous Every 24 hours 12/18/13 1909     12/19/13 0600  [MAR Hold]  Ampicillin-Sulbactam (UNASYN) 3 g in sodium chloride 0.9 % 100 mL IVPB     (On MAR Hold since 12/18/13 1539)   3 g 100 mL/hr over 60 Minutes Intravenous On call to O.R. 12/18/13 1426 12/18/13 1550   12/18/13 1715  ertapenem (INVANZ) 1 g in sodium chloride 0.9 % 50 mL IVPB     1 g 100 mL/hr over 30 Minutes Intravenous  Once 12/18/13 1710 12/18/13 1941      Assessment/Plan: Problem List: Patient Active Problem List   Diagnosis Date Noted  . Acute appendicitis with rupture 12/18/2013    Stable postop Offer clears po 1 Day Post-Op    LOS: 1 day   Matt B. MaHassell DoneMD, FASpectrum Health [facility], [facility] [phone] [phone]/31/2015 8:20 AM

## 2013-12-20 ENCOUNTER — Encounter (HOSPITAL_COMMUNITY): Payer: Self-pay | Admitting: General Surgery

## 2013-12-20 MED ORDER — VANCOMYCIN HCL 10 G IV SOLR
1500.0000 mg | Freq: Two times a day (BID) | INTRAVENOUS | Status: DC
  Administered 2013-12-20 – 2013-12-22 (×5): 1500 mg via INTRAVENOUS
  Filled 2013-12-20 (×8): qty 1500

## 2013-12-20 NOTE — ED Provider Notes (Signed)
Medical screening examination/treatment/procedure(s) were performed by non-physician practitioner and as supervising physician I was immediately available for consultation/collaboration.   EKG Interpretation None       Flint Melter, MD 12/20/13 1109

## 2013-12-20 NOTE — Progress Notes (Signed)
ANTIBIOTIC CONSULT NOTE - INITIAL  Pharmacy Consult for Vancomycin Indication: Cellulitis  No Known Allergies  Patient Measurements: Height: 6' (182.9 cm) Weight: 175 lb (79.379 kg) IBW/kg (Calculated) : 77.6 Adjusted Body Weight:    Vital Signs: Temp: 98.2 F (36.8 C) (06/01 0524) Temp src: Oral (05/31 2100) BP: 118/74 mmHg (06/01 0524) Pulse Rate: 103 (06/01 0524) Intake/Output from previous day:   Intake/Output from this shift:    Labs:  Recent Labs  12/18/13 1148  WBC 7.9  HGB 15.9  PLT 181  CREATININE 0.86   Estimated Creatinine Clearance: 142.9 ml/min (by C-G formula based on Cr of 0.86). No results found for this basename: VANCOTROUGH, VANCOPEAK, VANCORANDOM, GENTTROUGH, GENTPEAK, GENTRANDOM, TOBRATROUGH, TOBRAPEAK, TOBRARND, AMIKACINPEAK, AMIKACINTROU, AMIKACIN,  in the last 72 hours   Microbiology: No results found for this or any previous visit (from the past 720 hour(s)).  Medical History: History reviewed. No pertinent past medical history.  Medications:  Prescriptions prior to admission  Medication Sig Dispense Refill  . ibuprofen (ADVIL,MOTRIN) 200 MG tablet Take 800 mg by mouth every 6 (six) hours as needed.      . valACYclovir (VALTREX) 500 MG tablet Take 500 mg by mouth 2 (two) times a week. Monday and Friday       Assessment: s/p lap appendectomy for perforated appendix.  ID: Extensive area of cellulitis of his LLQ incision extending down his groin and posterior into his back and hip that is tender. Tmax 99.1. WBC 7.9.  Invanz 5/30>> Vanco 6/1>> Valtrex from PTA>>   Goal of Therapy:  Vancomycin trough level 10-15 mcg/ml  Plan:  Vancomycin 1500mg  IV q12h Check trough after 3-5 doses at steady state.  Danijela Vessey S. Merilynn Finland, PharmD, [facility] Clinical Staff Pharmacist Pager [phone]  [facility] Merilynn Finland 12/20/2013,8:59 AM

## 2013-12-20 NOTE — Progress Notes (Signed)
I have seen and examined the patient and agree with the assessment and plans.  Vergil Burby A. Wendee Hata  MD, FACS  

## 2013-12-20 NOTE — Progress Notes (Signed)
Patient ID: Dylan Parsons, male   DOB: 05-11-1986, 28 y.o.   MRN: [id number] 2 Days Post-Op  Subjective: Pt feels ok.  Pain is well controlled with oral pain meds.  Ambulating well.  Taking clears with no nausea.  No flatus yet, but wants solid food.  Objective: Vital signs in last 24 hours: Temp:  [98.2 F (36.8 C)-99.1 F (37.3 C)] 98.2 F (36.8 C) (06/01 0524) Pulse Rate:  [99-112] 103 (06/01 0524) Resp:  [18-19] 18 (06/01 0524) BP: (118-129)/(74-78) 118/74 mmHg (06/01 0524) SpO2:  [96 %-100 %] 100 % (06/01 0524)    Intake/Output from previous day:   Intake/Output this shift:    PE: Abd: soft, appropriately tender, +BS, incisions are c/d/i; however he has an extensive area of cellulitis of his LLQ incision extending down his groin and posterior into his back and hip. This is tender.  Lab Results:   Recent Labs  12/18/13 1148  WBC 7.9  HGB 15.9  HCT 44.0  PLT 181   BMET  Recent Labs  12/18/13 1148  NA 138  K 4.0  CL 102  CO2 25  GLUCOSE 81  BUN 8  CREATININE 0.86  CALCIUM 9.8   PT/INR No results found for this basename: LABPROT, INR,  in the last 72 hours CMP     Component Value Date/Time   NA 138 12/18/2013 1148   K 4.0 12/18/2013 1148   CL 102 12/18/2013 1148   CO2 25 12/18/2013 1148   GLUCOSE 81 12/18/2013 1148   BUN 8 12/18/2013 1148   CREATININE 0.86 12/18/2013 1148   CALCIUM 9.8 12/18/2013 1148   PROT 7.7 12/18/2013 1148   ALBUMIN 4.5 12/18/2013 1148   AST 17 12/18/2013 1148   ALT 11 12/18/2013 1148   ALKPHOS 81 12/18/2013 1148   BILITOT 1.4* 12/18/2013 1148   GFRNONAA >90 12/18/2013 1148   GFRAA >90 12/18/2013 1148   Lipase     Component Value Date/Time   LIPASE 36 12/18/2013 1148       Studies/Results: Ct Abdomen Pelvis W Contrast  12/18/2013   CLINICAL DATA:  Mid abdominal pain  EXAM: CT ABDOMEN AND PELVIS WITH CONTRAST  TECHNIQUE: Multidetector CT imaging of the abdomen and pelvis was performed using the standard protocol following bolus  administration of intravenous contrast.  CONTRAST:  OMNIPAQUE IOHEXOL 300 MG/ML SOLN, 69mL OMNIPAQUE IOHEXOL 300 MG/ML SOLN  COMPARISON:  None.  FINDINGS: The lung bases are free of acute infiltrate or sizable effusion.  The liver, gallbladder, spleen, adrenal glands and pancreas are all normal in their CT appearance. Kidneys are well visualized bilaterally. No renal calculi or urinary tract obstructive changes are seen.  In the right lower quadrant there is considerable inflammatory change surrounding the dilated appendix. Appendicolith is noted within the appendix. It is maximally dilated to 2 cm. No free pelvic fluid is noted. The bladder is well distended. No acute bony abnormality is seen.  IMPRESSION: Changes consistent with acute appendicitis   Electronically Signed   By: Alcide Clever M.D.   On: 12/18/2013 13:19    Anti-infectives: Anti-infectives   Start     Dose/Rate Route Frequency Ordered Stop   12/20/13 1000  valACYclovir (VALTREX) tablet 500 mg     500 mg Oral Once per day on Mon Fri 12/18/13 1909     12/19/13 1800  ertapenem (INVANZ) 1 g in sodium chloride 0.9 % 50 mL IVPB     1 g 100 mL/hr over 30 Minutes Intravenous  Every 24 hours 12/18/13 1909     12/19/13 0600  [MAR Hold]  Ampicillin-Sulbactam (UNASYN) 3 g in sodium chloride 0.9 % 100 mL IVPB     (On MAR Hold since 12/18/13 1539)   3 g 100 mL/hr over 60 Minutes Intravenous On call to O.R. 12/18/13 1426 12/18/13 1550   12/18/13 1715  ertapenem (INVANZ) 1 g in sodium chloride 0.9 % 50 mL IVPB     1 g 100 mL/hr over 30 Minutes Intravenous  Once 12/18/13 1710 12/18/13 1941       Assessment/Plan  1. POD 2, s/p lap appy for perforated appendix 2. Cellulitis of abdominal wall  Plan: 1. Significant care of cellulitis of his LLQ.  Cont Invanz (D3), but add IV vanc for better Gram + coverage. 2. Advance to solid diet today 3. Cont to mobilize and pulm toilet   LOS: 2 days    Letha CapeKelly E Amberleigh Gerken 12/20/2013, 8:37 AM Pager:  [phone]

## 2013-12-21 ENCOUNTER — Encounter (HOSPITAL_COMMUNITY): Payer: Self-pay | Admitting: General Practice

## 2013-12-21 LAB — BASIC METABOLIC PANEL
BUN: 9 mg/dL (ref 6–23)
CALCIUM: 8.9 mg/dL (ref 8.4–10.5)
CO2: 33 mEq/L — ABNORMAL HIGH (ref 19–32)
Chloride: 97 mEq/L (ref 96–112)
Creatinine, Ser: 0.97 mg/dL (ref 0.50–1.35)
GLUCOSE: 98 mg/dL (ref 70–99)
Potassium: 3.8 mEq/L (ref 3.7–5.3)
Sodium: 137 mEq/L (ref 137–147)

## 2013-12-21 LAB — CBC
HCT: 36.7 % — ABNORMAL LOW (ref 39.0–52.0)
Hemoglobin: 13.1 g/dL (ref 13.0–17.0)
MCH: 31.3 pg (ref 26.0–34.0)
MCHC: 35.7 g/dL (ref 30.0–36.0)
MCV: 87.6 fL (ref 78.0–100.0)
PLATELETS: 157 10*3/uL (ref 150–400)
RBC: 4.19 MIL/uL — ABNORMAL LOW (ref 4.22–5.81)
RDW: 12.3 % (ref 11.5–15.5)
WBC: 8.1 10*3/uL (ref 4.0–10.5)

## 2013-12-21 MED ORDER — ALUM & MAG HYDROXIDE-SIMETH 200-200-20 MG/5ML PO SUSP
30.0000 mL | Freq: Four times a day (QID) | ORAL | Status: DC | PRN
  Administered 2013-12-21 – 2013-12-23 (×4): 30 mL via ORAL
  Filled 2013-12-21 (×3): qty 30

## 2013-12-21 NOTE — Progress Notes (Signed)
I have seen and examined the patient and agree with the assessment and plans. Still with impressive left flank cellulitis and edema.  Needs continued IV antibiotics  Lexys Milliner A. Magnus Ivan  MD, FACS

## 2013-12-21 NOTE — Progress Notes (Signed)
Patient ID: Dylan Parsons, male   DOB: 15-Dec-1985, 28 y.o.   MRN: [id number] 3 Days Post-Op  Subjective: Pt feels ok today.  Still taking percocet every 4 hours for pain.  Passing flatus.  Tolerating a regular diet with no nausea.  C/o some left sided scrotal soreness  Objective: Vital signs in last 24 hours: Temp:  [98.5 F (36.9 C)-98.9 F (37.2 C)] 98.5 F (36.9 C) (06/02 0621) Pulse Rate:  [94-114] 94 (06/02 0621) Resp:  [15-18] 15 (06/02 0621) BP: (117-139)/(69-85) 117/69 mmHg (06/02 0621) SpO2:  [95 %-100 %] 95 % (06/02 0621) Last BM Date: 12/17/13 (PTA)  Intake/Output from previous day: 06/01 0701 - 06/02 0700 In: 1000 [IV Piggyback:1000] Out: -  Intake/Output this shift:    PE: Abd: soft, +BS, appropriately tender, incisions c/d/i, cellulitis is lighter today overall.  Some receeding from drawn lines.  Still quite erythematous more lateral on his flank.  Lab Results:   Recent Labs  12/18/13 1148 12/21/13 0540  WBC 7.9 8.1  HGB 15.9 13.1  HCT 44.0 36.7*  PLT 181 157   BMET  Recent Labs  12/18/13 1148 12/21/13 0540  NA 138 137  K 4.0 3.8  CL 102 97  CO2 25 33*  GLUCOSE 81 98  BUN 8 9  CREATININE 0.86 0.97  CALCIUM 9.8 8.9   PT/INR No results found for this basename: LABPROT, INR,  in the last 72 hours CMP     Component Value Date/Time   NA 137 12/21/2013 0540   K 3.8 12/21/2013 0540   CL 97 12/21/2013 0540   CO2 33* 12/21/2013 0540   GLUCOSE 98 12/21/2013 0540   BUN 9 12/21/2013 0540   CREATININE 0.97 12/21/2013 0540   CALCIUM 8.9 12/21/2013 0540   PROT 7.7 12/18/2013 1148   ALBUMIN 4.5 12/18/2013 1148   AST 17 12/18/2013 1148   ALT 11 12/18/2013 1148   ALKPHOS 81 12/18/2013 1148   BILITOT 1.4* 12/18/2013 1148   GFRNONAA >90 12/21/2013 0540   GFRAA >90 12/21/2013 0540   Lipase     Component Value Date/Time   LIPASE 36 12/18/2013 1148       Studies/Results: No results found.  Anti-infectives: Anti-infectives   Start     Dose/Rate Route Frequency  Ordered Stop   12/20/13 1000  valACYclovir (VALTREX) tablet 500 mg     500 mg Oral Once per day on Mon Fri 12/18/13 1909     12/20/13 1000  vancomycin (VANCOCIN) 1,500 mg in sodium chloride 0.9 % 500 mL IVPB     1,500 mg 250 mL/hr over 120 Minutes Intravenous Every 12 hours 12/20/13 0903     12/19/13 1800  ertapenem (INVANZ) 1 g in sodium chloride 0.9 % 50 mL IVPB     1 g 100 mL/hr over 30 Minutes Intravenous Every 24 hours 12/18/13 1909     12/19/13 0600  [MAR Hold]  Ampicillin-Sulbactam (UNASYN) 3 g in sodium chloride 0.9 % 100 mL IVPB     (On MAR Hold since 12/18/13 1539)   3 g 100 mL/hr over 60 Minutes Intravenous On call to O.R. 12/18/13 1426 12/18/13 1550   12/18/13 1715  ertapenem (INVANZ) 1 g in sodium chloride 0.9 % 50 mL IVPB     1 g 100 mL/hr over 30 Minutes Intravenous  Once 12/18/13 1710 12/18/13 1941       Assessment/Plan  1. POD 3, s/p lap appy for perforated appendicitis 2. Left flank/LLQ cellulitis  Plan: 1. Cont  Vanc/Invanz for cellulitis and for appendicitis 2. Cont regular diet 3. Anticipate dc home when cellulitis improved.   LOS: 3 days    Letha CapeKelly E Naseem Varden 12/21/2013, 7:52 AM Pager: [phone]

## 2013-12-22 DIAGNOSIS — L0390 Cellulitis, unspecified: Secondary | ICD-10-CM

## 2013-12-22 DIAGNOSIS — Z9089 Acquired absence of other organs: Secondary | ICD-10-CM

## 2013-12-22 DIAGNOSIS — L0291 Cutaneous abscess, unspecified: Secondary | ICD-10-CM

## 2013-12-22 LAB — CBC WITH DIFFERENTIAL/PLATELET
BASOS ABS: 0 10*3/uL (ref 0.0–0.1)
Basophils Relative: 0 % (ref 0–1)
Eosinophils Absolute: 0.1 10*3/uL (ref 0.0–0.7)
Eosinophils Relative: 1 % (ref 0–5)
HCT: 37.3 % — ABNORMAL LOW (ref 39.0–52.0)
Hemoglobin: 13.4 g/dL (ref 13.0–17.0)
Lymphocytes Relative: 12 % (ref 12–46)
Lymphs Abs: 0.8 10*3/uL (ref 0.7–4.0)
MCH: 31.4 pg (ref 26.0–34.0)
MCHC: 35.9 g/dL (ref 30.0–36.0)
MCV: 87.4 fL (ref 78.0–100.0)
Monocytes Absolute: 0.8 10*3/uL (ref 0.1–1.0)
Monocytes Relative: 11 % (ref 3–12)
NEUTROS ABS: 5.4 10*3/uL (ref 1.7–7.7)
NEUTROS PCT: 76 % (ref 43–77)
Platelets: 201 10*3/uL (ref 150–400)
RBC: 4.27 MIL/uL (ref 4.22–5.81)
RDW: 12.4 % (ref 11.5–15.5)
WBC: 7.1 10*3/uL (ref 4.0–10.5)

## 2013-12-22 LAB — BASIC METABOLIC PANEL
BUN: 9 mg/dL (ref 6–23)
CHLORIDE: 96 meq/L (ref 96–112)
CO2: 31 mEq/L (ref 19–32)
CREATININE: 0.84 mg/dL (ref 0.50–1.35)
Calcium: 8.8 mg/dL (ref 8.4–10.5)
GFR calc non Af Amer: >90 mL/min (ref >90)
Glucose, Bld: 107 mg/dL — ABNORMAL HIGH (ref 70–99)
POTASSIUM: 3.4 meq/L — AB (ref 3.7–5.3)
Sodium: 137 mEq/L (ref 137–147)

## 2013-12-22 LAB — VANCOMYCIN, TROUGH: VANCOMYCIN TR: 6.2 ug/mL — AB (ref 10.0–20.0)

## 2013-12-22 MED ORDER — VANCOMYCIN HCL 10 G IV SOLR
1500.0000 mg | Freq: Three times a day (TID) | INTRAVENOUS | Status: DC
  Administered 2013-12-22 – 2013-12-23 (×2): 1500 mg via INTRAVENOUS
  Filled 2013-12-22 (×5): qty 1500

## 2013-12-22 MED ORDER — CLINDAMYCIN PHOSPHATE 600 MG/50ML IV SOLN
600.0000 mg | Freq: Three times a day (TID) | INTRAVENOUS | Status: DC
  Administered 2013-12-22 – 2013-12-24 (×7): 600 mg via INTRAVENOUS
  Filled 2013-12-22 (×9): qty 50

## 2013-12-22 MED ORDER — CEFAZOLIN SODIUM-DEXTROSE 2-3 GM-% IV SOLR
2.0000 g | Freq: Three times a day (TID) | INTRAVENOUS | Status: DC
  Administered 2013-12-22 – 2013-12-24 (×6): 2 g via INTRAVENOUS
  Filled 2013-12-22 (×8): qty 50

## 2013-12-22 NOTE — Progress Notes (Signed)
Assessment: s/p lap appendectomy for perforated appendix.  ID: Extensive area of cellulitis of his LLQ incision extending down his groin and posterior into his back and hip that is tender. Afebrile. WBC 8.1 6/2 MD: cellulitis is lighter today overall. Some receeding from drawn lines. Still quite erythematous more lateral on his flank. 6/3: cellulitis superiorly is fading and has improved some, but he continues to have pretty significant cellulitis of his left flank and hip area. Now extending further down his lateral and posterior left thigh. New margins have been drawn today.  Invanz 5/30>>6/3 Clinda 6/3>> Vanco 6/1>>  6/3: VT: 6.3 Valtrex from PTA>>  Anticoagulation: Lovenox 40mg /d. CBC WNL  Cardiovascular: VSS  GI: s/p appendectomy. Tolerating regular diet.   Neuro: Still taking Percocet frequently  Nephrology: Scr 0.97 with CrCl>100   Goal of Therapy:  Vancomycin trough level 10-15 mcg/ml  Plan:  Vancomycin 1500mg  IV increase to q8hr. Home when cellulitis improves.

## 2013-12-22 NOTE — Progress Notes (Signed)
Patient ID: Dylan Parsons, male   DOB: 01/10/1986, 28 y.o.   MRN: [id number] 4 Days Post-Op  Subjective: Pt feels ok.  Started passing a lot more flatus yesterday.  Tolerating a solid diet.  Pain is improving each day.  Objective: Vital signs in last 24 hours: Temp:  [98.5 F (36.9 C)-98.9 F (37.2 C)] 98.9 F (37.2 C) (06/03 0507) Pulse Rate:  [96-103] 96 (06/03 0507) Resp:  [15-18] 16 (06/03 0507) BP: (119-144)/(73-90) 126/73 mmHg (06/03 0507) SpO2:  [93 %-97 %] 97 % (06/03 0507) Last BM Date: 12/21/13  Intake/Output from previous day: 06/02 0701 - 06/03 0700 In: 1420 [P.O.:420; IV Piggyback:1000] Out: -  Intake/Output this shift:    PE: Abd: soft, +BS, appropriately tender, ND, cellulitis superiorly is fading and has improved some, but he continues to have pretty significant cellulitis of his left flank and hip area.  Now extending further down his lateral and posterior left thigh.  New margins have been drawn today.  Lab Results:   Recent Labs  12/21/13 0540  WBC 8.1  HGB 13.1  HCT 36.7*  PLT 157   BMET  Recent Labs  12/21/13 0540  NA 137  K 3.8  CL 97  CO2 33*  GLUCOSE 98  BUN 9  CREATININE 0.97  CALCIUM 8.9   PT/INR No results found for this basename: LABPROT, INR,  in the last 72 hours CMP     Component Value Date/Time   NA 137 12/21/2013 0540   K 3.8 12/21/2013 0540   CL 97 12/21/2013 0540   CO2 33* 12/21/2013 0540   GLUCOSE 98 12/21/2013 0540   BUN 9 12/21/2013 0540   CREATININE 0.97 12/21/2013 0540   CALCIUM 8.9 12/21/2013 0540   PROT 7.7 12/18/2013 1148   ALBUMIN 4.5 12/18/2013 1148   AST 17 12/18/2013 1148   ALT 11 12/18/2013 1148   ALKPHOS 81 12/18/2013 1148   BILITOT 1.4* 12/18/2013 1148   GFRNONAA >90 12/21/2013 0540   GFRAA >90 12/21/2013 0540   Lipase     Component Value Date/Time   LIPASE 36 12/18/2013 1148       Studies/Results: No results found.  Anti-infectives: Anti-infectives   Start     Dose/Rate Route Frequency Ordered Stop   12/22/13 0815  clindamycin (CLEOCIN) IVPB 600 mg     600 mg 100 mL/hr over 30 Minutes Intravenous 3 times per day 12/22/13 0801     12/20/13 1000  valACYclovir (VALTREX) tablet 500 mg     500 mg Oral Once per day on Mon Fri 12/18/13 1909     12/20/13 1000  vancomycin (VANCOCIN) 1,500 mg in sodium chloride 0.9 % 500 mL IVPB     1,500 mg 250 mL/hr over 120 Minutes Intravenous Every 12 hours 12/20/13 0903     12/19/13 1800  ertapenem (INVANZ) 1 g in sodium chloride 0.9 % 50 mL IVPB  Status:  Discontinued     1 g 100 mL/hr over 30 Minutes Intravenous Every 24 hours 12/18/13 1909 12/22/13 0801   12/19/13 0600  [MAR Hold]  Ampicillin-Sulbactam (UNASYN) 3 g in sodium chloride 0.9 % 100 mL IVPB     (On MAR Hold since 12/18/13 1539)   3 g 100 mL/hr over 60 Minutes Intravenous On call to O.R. 12/18/13 1426 12/18/13 1550   12/18/13 1715  ertapenem (INVANZ) 1 g in sodium chloride 0.9 % 50 mL IVPB     1 g 100 mL/hr over 30 Minutes Intravenous  Once 12/18/13  1710 12/18/13 1941       Assessment/Plan  1. POD 4, s/p lap appy for intra-operative ruptured appendicitis 2. Cellulitis of LLQ, left flank, and left thigh  Plan: 1. Patient doing well from surgery, but cellulitis persists and is actually worsening inferiorly. Will cont vanc, but stop invanz and switch to clinda.  I have also consulted ID to ask for their opinion on abx coverage. 2. Check bmet in the aM and follow creatinine.    LOS: 4 days    Letha Cape 12/22/2013, 8:02 AM Pager: [phone]

## 2013-12-22 NOTE — Progress Notes (Signed)
I have seen and examined the patient and agree with the assessment and plans. surprising erythema post op from trochar site.  Adjusting antibiotics and will get ID's opinion  Robet Crutchfield A. Magnus Ivan  MD, FACS

## 2013-12-22 NOTE — Consult Note (Signed)
[facility]  Total days of antibiotics 5        Day 1 clindamycin        Day 3 vanco        (previously had amp/sub + erta x 2)       Reason for Consult: cellulitis    Referring Physician: [facility]  Active Problems:   Acute appendicitis with rupture    HPI: Dylan Parsons is a 28 y.o. male with no significant past medical history who presented to the ED on 5/30 with  abdominal pain x 5 days. He described it as dull ache, thought it was due to gas/constipation. Tolerated physical activity, weight lifting without difficulty.he initially presented to urgent care for evaluation who then referred him to ed due to having clinical presentation and radiology signs c/w acute appendicitis. He emergently went to OR for appendectomy. OR note states that the appendix rupture while attempting removal. He did have 4L lavage to remove contents. He was placed on ertapenem for ruptured appy. He was doing well post operatively, afebrile until POD#2 when he started to have cellulitis to LLQ including surgical incision site. Vancomycin was started for better gram positive coverage which showed milf improved in the next 24hrs. However now on starting day #3 of vancomycin, cellulitis appears worse on back. Clindamycin added due to concern for staph/strep toxin  Past Medical History  Diagnosis Date  . Complication of anesthesia     "anesthesia wears off rapidly"  . Family history of anesthesia complication     "anesthesia wears off rapidly for my mom"  . Depression     Allergies: No Known Allergies   MEDICATIONS: .  ceFAZolin (ANCEF) IV  2 g Intravenous 3 times per day  . clindamycin (CLEOCIN) IV  600 mg Intravenous 3 times per day  . enoxaparin (LOVENOX) injection  40 mg Subcutaneous Q24H  . valACYclovir  500 mg Oral Once per day on Mon Fri  . vancomycin  1,500 mg Intravenous Q8H    History  Substance Use Topics  . Smoking status: Never Smoker   . Smokeless tobacco: Never Used   . Alcohol Use: 4.2 oz/week    7 Cans of beer per week    History reviewed. No pertinent family history.   Review of Systems  Constitutional: Negative for fever, chills, diaphoresis, activity change, appetite change, fatigue and unexpected weight change.  HENT: Negative for congestion, sore throat, rhinorrhea, sneezing, trouble swallowing and sinus pressure.  Eyes: Negative for photophobia and visual disturbance.  Respiratory: Negative for cough, chest tightness, shortness of breath, wheezing and stridor.  Cardiovascular: Negative for chest pain, palpitations and leg swelling.  Gastrointestinal: Negative for nausea, vomiting, abdominal pain, diarrhea, constipation, blood in stool, abdominal distention and anal bleeding.  Genitourinary: Negative for dysuria, hematuria, flank pain and difficulty urinating.  Musculoskeletal: Negative for myalgias, back pain, joint swelling, arthralgias and gait problem.  Skin: Negative for color change, pallor, rash and wound.  Neurological: Negative for dizziness, tremors, weakness and light-headedness.  Hematological: Negative for adenopathy. Does not bruise/bleed easily.  Psychiatric/Behavioral: Negative for behavioral problems, confusion, sleep disturbance, dysphoric mood, decreased concentration and agitation.     OBJECTIVE: Temp:  [98.5 F (36.9 C)-98.9 F (37.2 C)] 98.9 F (37.2 C) (06/03 0507) Pulse Rate:  [96-103] 96 (06/03 0507) Resp:  [15-18] 16 (06/03 0507) BP: (119-144)/(73-90) 126/73 mmHg (06/03 0507) SpO2:  [93 %-97 %] 97 % (06/03 0507)  Constitutional: He is oriented to person, place,  and time. He appears well-developed and well-nourished. No distress. Nontoxic appearing watching tv HENT:  Mouth/Throat: Oropharynx is clear and moist. No oropharyngeal exudate.  Cardiovascular: Normal rate, regular rhythm and normal heart sounds. Exam reveals no gallop and no friction rub.  No murmur heard.  Pulmonary/Chest: Effort normal and breath  sounds normal. No respiratory distress. He has no wheezes.  Abdominal: Soft. Bowel sounds are normal. He exhibits no distension. There is no tenderness.  Lymphadenopathy:  He has no cervical adenopathy.  Neurological: He is alert and oriented to person, place, and time.  Skin: left lower quadrant of abdomen, left flank, left upper thigh groin blanching erythema. Improved on back but erythema extends beyond mark on left leg Psychiatric: He has a normal mood and affect. His behavior is normal.    LABS: Results for orders placed during the hospital encounter of 12/18/13 (from the past 48 hour(s))  BASIC METABOLIC PANEL     Status: Abnormal   Collection Time    12/21/13  5:40 AM      Result Value Ref Range   Sodium 137  137 - 147 mEq/L   Potassium 3.8  3.7 - 5.3 mEq/L   Chloride 97  96 - 112 mEq/L   CO2 33 (*) 19 - 32 mEq/L   Glucose, Bld 98  70 - 99 mg/dL   BUN 9  6 - 23 mg/dL   Creatinine, Ser 0.97  0.50 - 1.35 mg/dL   Calcium 8.9  8.4 - 10.5 mg/dL   GFR calc non Af Amer >90  >90 mL/min   GFR calc Af Amer >90  >90 mL/min   Comment: (NOTE)     The eGFR has been calculated using the CKD EPI equation.     This calculation has not been validated in all clinical situations.     eGFR's persistently <90 mL/min signify possible Chronic Kidney     Disease.  CBC     Status: Abnormal   Collection Time    12/21/13  5:40 AM      Result Value Ref Range   WBC 8.1  4.0 - 10.5 K/uL   RBC 4.19 (*) 4.22 - 5.81 MIL/uL   Hemoglobin 13.1  13.0 - 17.0 g/dL   HCT 36.7 (*) 39.0 - 52.0 %   MCV 87.6  78.0 - 100.0 fL   MCH 31.3  26.0 - 34.0 pg   MCHC 35.7  30.0 - 36.0 g/dL   RDW 12.3  11.5 - 15.5 %   Platelets 157  150 - 400 K/uL  VANCOMYCIN, TROUGH     Status: Abnormal   Collection Time    12/22/13 10:09 AM      Result Value Ref Range   Vancomycin Tr 6.2 (*) 10.0 - 20.0 ug/mL    MICRO: No micro IMAGING: 5/30 abd ct The lung bases are free of acute infiltrate or sizable effusion.  The liver,  gallbladder, spleen, adrenal glands and pancreas are all  normal in their CT appearance. Kidneys are well visualized  bilaterally. No renal calculi or urinary tract obstructive changes  are seen.  In the right lower quadrant there is considerable inflammatory  change surrounding the dilated appendix. Appendicolith is noted  within the appendix. It is maximally dilated to 2 cm. No free pelvic  fluid is noted. The bladder is well distended. No acute bony  abnormality is seen.  IMPRESSION:  Changes consistent with acute appendicitis  Assessment/Plan:  28yo male with ruptured appendicitis s/p appy c/b  Cellulitis likely MSSA or strep  - recommend to treat with vanco, cefazolin plus clindamycin - coverage of rupture appy - would have adequate broad spectrum antibiotics with clinda/cef/vanco - he doesn't have history of MRSA infection, if continued to improve tomorrow, would discontinue vancomycin - will watch for progress on narrowed regimen before switching over to oral antibiotics - if he has fever, rec to do blood cx - will check hiv testing as part of routine healthcare  Fumie Fiallo B[facility] [phone]

## 2013-12-23 LAB — BASIC METABOLIC PANEL
BUN: 7 mg/dL (ref 6–23)
CHLORIDE: 100 meq/L (ref 96–112)
CO2: 30 mEq/L (ref 19–32)
Calcium: 8.5 mg/dL (ref 8.4–10.5)
Creatinine, Ser: 0.8 mg/dL (ref 0.50–1.35)
GFR calc Af Amer: >90 mL/min (ref >90)
GFR calc non Af Amer: >90 mL/min (ref >90)
GLUCOSE: 103 mg/dL — AB (ref 70–99)
Potassium: 3.3 mEq/L — ABNORMAL LOW (ref 3.7–5.3)
Sodium: 140 mEq/L (ref 137–147)

## 2013-12-23 LAB — HIV ANTIBODY (ROUTINE TESTING W REFLEX): HIV 1&2 Ab, 4th Generation: NONREACTIVE

## 2013-12-23 MED ORDER — SIMETHICONE 80 MG PO CHEW
80.0000 mg | CHEWABLE_TABLET | Freq: Four times a day (QID) | ORAL | Status: DC | PRN
  Filled 2013-12-23: qty 1

## 2013-12-23 NOTE — Progress Notes (Signed)
    [facility]    Date of Admission:  12/18/2013   Total days of antibiotics 6        Day 2 clinda        Day 2 cefazolin           ID: Dylan Parsons is a 28 y.o. male with acute appendicitis s/p appy c/b cellulitis  Active Problems:   Acute appendicitis with rupture    Subjective: Afebrile, improvement with cellulitis after antibiotic change. He reports having a rough nite 2/2 gas.  Medications:  .  ceFAZolin (ANCEF) IV  2 g Intravenous 3 times per day  . clindamycin (CLEOCIN) IV  600 mg Intravenous 3 times per day  . enoxaparin (LOVENOX) injection  40 mg Subcutaneous Q24H  . valACYclovir  500 mg Oral Once per day on Mon Fri    Objective: Vital signs in last 24 hours: Temp:  [98.1 F (36.7 C)-99 F (37.2 C)] 99 F (37.2 C) (06/04 0519) Pulse Rate:  [90-95] 90 (06/04 0519) Resp:  [16-17] 17 (06/04 0519) BP: (124-138)/(76-86) 124/77 mmHg (06/04 0519) SpO2:  [97 %] 97 % (06/04 0519) gen = a xo by 3 Skin= erythema receded most improved on abdomen and flank. Lab Results  Recent Labs  12/21/13 0540 12/22/13 1430 12/23/13 0446  WBC 8.1 7.1  --   HGB 13.1 13.4  --   HCT 36.7* 37.3*  --   NA 137 137 140  K 3.8 3.4* 3.3*  CL 97 96 100  CO2 33* 31 30  BUN 9 9 7   CREATININE 0.97 0.84 0.80    Assessment/Plan: Cellulitis = for discharge recommend to give oral antibiotics of keflex 500mg  QID plus clinda 300mg  TID x 5 days (exclude what he has received already) to finish out course of cellulitis, and would cover appendicitis.  Will sign off, call if questions  [facility] Cell: [phone] Pager: [phone]  12/23/2013, 11:33 AM

## 2013-12-23 NOTE — Progress Notes (Signed)
Patient ID: Dylan Parsons, male   DOB: 1985/07/24, 28 y.o.   MRN: [id number] 5 Days Post-Op  Subjective: Pt having some trouble with gas pains.  Otherwise still doing well.  Objective: Vital signs in last 24 hours: Temp:  [98.1 F (36.7 C)-99 F (37.2 C)] 99 F (37.2 C) (06/04 0519) Pulse Rate:  [90-95] 90 (06/04 0519) Resp:  [16-17] 17 (06/04 0519) BP: (124-138)/(76-86) 124/77 mmHg (06/04 0519) SpO2:  [97 %] 97 % (06/04 0519) Last BM Date: 12/21/13  Intake/Output from previous day: 06/03 0701 - 06/04 0700 In: 1610 [P.O.:960; IV Piggyback:650] Out: -  Intake/Output this shift: Total I/O In: 200 [P.O.:200] Out: -   PE: Abd: soft, cellulitis is SIGNIFICANTLY improved!!  Minimal erythema even present. +BS, incisions c/d/i  Lab Results:   Recent Labs  12/21/13 0540 12/22/13 1430  WBC 8.1 7.1  HGB 13.1 13.4  HCT 36.7* 37.3*  PLT 157 201   BMET  Recent Labs  12/22/13 1430 12/23/13 0446  NA 137 140  K 3.4* 3.3*  CL 96 100  CO2 31 30  GLUCOSE 107* 103*  BUN 9 7  CREATININE 0.84 0.80  CALCIUM 8.8 8.5   PT/INR No results found for this basename: LABPROT, INR,  in the last 72 hours CMP     Component Value Date/Time   NA 140 12/23/2013 0446   K 3.3* 12/23/2013 0446   CL 100 12/23/2013 0446   CO2 30 12/23/2013 0446   GLUCOSE 103* 12/23/2013 0446   BUN 7 12/23/2013 0446   CREATININE 0.80 12/23/2013 0446   CALCIUM 8.5 12/23/2013 0446   PROT 7.7 12/18/2013 1148   ALBUMIN 4.5 12/18/2013 1148   AST 17 12/18/2013 1148   ALT 11 12/18/2013 1148   ALKPHOS 81 12/18/2013 1148   BILITOT 1.4* 12/18/2013 1148   GFRNONAA >90 12/23/2013 0446   GFRAA >90 12/23/2013 0446   Lipase     Component Value Date/Time   LIPASE 36 12/18/2013 1148       Studies/Results: No results found.  Anti-infectives: Anti-infectives   Start     Dose/Rate Route Frequency Ordered Stop   12/22/13 1900  vancomycin (VANCOCIN) 1,500 mg in sodium chloride 0.9 % 500 mL IVPB  Status:  Discontinued     1,500  mg 250 mL/hr over 120 Minutes Intravenous Every 8 hours 12/22/13 1101 12/23/13 0823   12/22/13 1400  ceFAZolin (ANCEF) IVPB 2 g/50 mL premix     2 g 100 mL/hr over 30 Minutes Intravenous 3 times per day 12/22/13 1219     12/22/13 0845  clindamycin (CLEOCIN) IVPB 600 mg     600 mg 100 mL/hr over 30 Minutes Intravenous 3 times per day 12/22/13 0801     12/20/13 1000  valACYclovir (VALTREX) tablet 500 mg     500 mg Oral Once per day on Mon Fri 12/18/13 1909     12/20/13 1000  vancomycin (VANCOCIN) 1,500 mg in sodium chloride 0.9 % 500 mL IVPB  Status:  Discontinued     1,500 mg 250 mL/hr over 120 Minutes Intravenous Every 12 hours 12/20/13 0903 12/22/13 1101   12/19/13 1800  ertapenem (INVANZ) 1 g in sodium chloride 0.9 % 50 mL IVPB  Status:  Discontinued     1 g 100 mL/hr over 30 Minutes Intravenous Every 24 hours 12/18/13 1909 12/22/13 0801   12/19/13 0600  [MAR Hold]  Ampicillin-Sulbactam (UNASYN) 3 g in sodium chloride 0.9 % 100 mL IVPB     (On  MAR Hold since 12/18/13 1539)   3 g 100 mL/hr over 60 Minutes Intravenous On call to O.R. 12/18/13 1426 12/18/13 1550   12/18/13 1715  ertapenem (INVANZ) 1 g in sodium chloride 0.9 % 50 mL IVPB     1 g 100 mL/hr over 30 Minutes Intravenous  Once 12/18/13 1710 12/18/13 1941       Assessment/Plan 1. POD 5, s/p lap appy for intra-operative ruptured appendicitis  2. Cellulitis of LLQ, left flank, and left thigh  Plan: 1. Cellulitis significantly improved!!  Will dc vanc today per ID recs yesterday.  Suspect he can be transitioned to oral abx.  Will await ID recommendations for this.  He will need 7 days total coverage for his appendix as well. He is on D5 today. 2. Anticipate home tomorrow    LOS: 5 days    Letha CapeKelly E Floreine Kingdon 12/23/2013, 8:24 AM Pager: [phone]

## 2013-12-23 NOTE — Progress Notes (Signed)
I have seen and examined the patient and agree with the assessment and plans. Hopefully home soon on oral antibiotics  Markanthony Gedney A. Magnus Ivan  MD, FACS

## 2013-12-24 MED ORDER — CEPHALEXIN 500 MG PO CAPS: 500.0000 mg | ORAL_CAPSULE | Freq: Four times a day (QID) | ORAL | Status: DC

## 2013-12-24 MED ORDER — CLINDAMYCIN HCL 300 MG PO CAPS: 300.0000 mg | ORAL_CAPSULE | Freq: Three times a day (TID) | ORAL | Status: DC

## 2013-12-24 MED ORDER — OXYCODONE-ACETAMINOPHEN 5-325 MG PO TABS: 1.0000 | ORAL_TABLET | ORAL | Status: DC | PRN

## 2013-12-24 NOTE — Discharge Summary (Signed)
Patient ID: Dylan Parsons MRN: [id number] DOB/AGE: Sep 11, 1985 28 y.o.  Admit date: 12/18/2013 Discharge date: 12/24/2013  Procedures: laparoscopic appendectomy  Consults: ID  Reason for Admission: Patient actually had this pain one year ago, more severe, but went away on its own. This last episode started Tuesday, worsened by Thursday, came to Urgent Care today and was ssent to the ED. Dx of acute appendicitis clinically and radiologically.  Admission Diagnoses:  1. Acute appendicitis  Hospital Course: The patient was admitted and taken to the operating room where he underwent a lap appy.  His appendix was perforated during removal.  On POD 1, the patient was doing ok.  He was given clear liquids and continues on his IV Invanz.  On POD 2, he was feeling better and passing flatus.  His diet was advanced as tolerated.  He was noted though to have a significant cellulitis of his LLQ and left flank.  Vancomycin was added for additional gram positive coverage.  It improved, but after 2 days began to extend down his left thigh.  His abx therapy was changed to Vanc, clinda, and ancef at the recommendation of ID.  His cellulitis rapidly improved after this transition. On POD 5, his cellulitis was significantly improved and he was otherwise stable for dc home.  PE: Abd: soft, appropriately tender, cellulitis resolving, anasarca still present, incisions c/d/i  Discharge Diagnoses:  Active Problems:   Acute appendicitis with rupture s/p lap appy Left flank cellulitis  Discharge Medications:   Medication List         cephALEXin 500 MG capsule  Commonly known as:  KEFLEX  Take 1 capsule (500 mg total) by mouth 4 (four) times daily.     clindamycin 300 MG capsule  Commonly known as:  CLEOCIN  Take 1 capsule (300 mg total) by mouth 3 (three) times daily.     ibuprofen 200 MG tablet  Commonly known as:  ADVIL,MOTRIN  Take 800 mg by mouth every 6 (six) hours as needed.     oxyCODONE-acetaminophen 5-325 MG per tablet  Commonly known as:  PERCOCET/ROXICET  Take 1-2 tablets by mouth every 4 (four) hours as needed for moderate pain.     valACYclovir 500 MG tablet  Commonly known as:  VALTREX  Take 500 mg by mouth 2 (two) times a week. Monday and Friday        Discharge Instructions:     Follow-up Information   Follow up with [facility] On 01/04/2014. (1:30pm, arrive at 1:00pm for paperwork)    Contact information:   [address]   [place] [address] [phone]       Signed: Letha Cape 12/24/2013, 9:11 AM

## 2013-12-24 NOTE — Discharge Summary (Signed)
I have seen and examined the patient and agree with the assessment and plans.  Destiney Sanabia A. Nuria Phebus  MD, FACS  

## 2013-12-24 NOTE — Progress Notes (Signed)
Discharge instructions were given and went over with patient and his mother. Patient was given a doctor's note and prescriptions for percocet, keflex, and clindamycin to take to his pharmacy. Patient stated that he did not have any questions. Volunteer and staff assisted patient to his transportation.

## 2013-12-24 NOTE — Discharge Instructions (Signed)
[facility] ______[facility] °LAPAROSCOPIC SURGERY: POST OP INSTRUCTIONS °Always review your discharge instruction sheet given to you by the facility where your surgery was performed. °IF YOU HAVE DISABILITY OR FAMILY LEAVE FORMS, YOU MUST BRING THEM TO THE OFFICE FOR PROCESSING.   °DO NOT GIVE THEM TO YOUR DOCTOR. ° °1. A prescription for pain medication may be given to you upon discharge.  Take your pain medication as prescribed, if needed.  If narcotic pain medicine is not needed, then you may take acetaminophen (Tylenol) or ibuprofen (Advil) as needed. °2. Take your usually prescribed medications unless otherwise directed. °3. If you need a refill on your pain medication, please contact your pharmacy.  They will contact our office to request authorization. Prescriptions will not be filled after 5pm or on week-ends. °4. You should follow a light diet the first few days after arrival home, such as soup and crackers, etc.  Be sure to include lots of fluids daily. °5. Most patients will experience some swelling and bruising in the area of the incisions.  Ice packs will help.  Swelling and bruising can take several days to resolve.  °6. It is common to experience some constipation if taking pain medication after surgery.  Increasing fluid intake and taking a stool softener (such as Colace) will usually help or prevent this problem from occurring.  A mild laxative (Milk of Magnesia or Miralax) should be taken according to package instructions if there are no bowel movements after 48 hours. °7. Unless discharge instructions indicate otherwise, you may remove your bandages 24-48 hours after surgery, and you may shower at that time.  You may have steri-strips (small skin tapes) in place directly over the incision.  These strips should be left on the skin for 7-10 days.  If your surgeon used skin glue on the incision, you may shower in 24 hours.  The glue will flake off over the next 2-3 weeks.  Any sutures or  staples will be removed at the office during your follow-up visit. °8. ACTIVITIES:  You may resume regular (light) daily activities beginning the next day--such as daily self-care, walking, climbing stairs--gradually increasing activities as tolerated.  You may have sexual intercourse when it is comfortable.  Refrain from any heavy lifting or straining until approved by your doctor. °a. You may drive when you are no longer taking prescription pain medication, you can comfortably wear a seatbelt, and you can safely maneuver your car and apply brakes. °b. RETURN TO WORK:  __________________________________________________________ °9. You should see your doctor in the office for a follow-up appointment approximately 2-3 weeks after your surgery.  Make sure that you call for this appointment within a day or two after you arrive home to insure a convenient appointment time. °10. OTHER INSTRUCTIONS: __________________________________________________________________________________________________________________________ __________________________________________________________________________________________________________________________ °WHEN TO CALL YOUR DOCTOR: °1. Fever over 101.0 °2. Inability to urinate °3. Continued bleeding from incision. °4. Increased pain, redness, or drainage from the incision. °5. Increasing abdominal pain ° °The clinic staff is available to answer your questions during regular business hours.  Please don’t hesitate to call and ask to speak to one of the nurses for clinical concerns.  If you have a medical emergency, go to the nearest emergency room or call 911.  A surgeon from [facility] is always on call at the hospital. °[address], [address], [place], [place]  [address] ? [address] [address], [place], [place]   [address] °[phone] ? [phone] ? FAX [phone] °Web site:   [email or web address] °

## 2013-12-29 ENCOUNTER — Telehealth (INDEPENDENT_AMBULATORY_CARE_PROVIDER_SITE_OTHER): Payer: Self-pay

## 2013-12-29 NOTE — Telephone Encounter (Signed)
Patient states he sneezed and now his incision is hurting . Denies drainage, swelling, temp, constipation. Advised for him to take ibuprofen and apply cool compress for comfort. Advised for him to call if symptoms becomes worse.

## 2014-01-04 ENCOUNTER — Ambulatory Visit (INDEPENDENT_AMBULATORY_CARE_PROVIDER_SITE_OTHER): Payer: 59 | Admitting: General Surgery

## 2014-01-04 ENCOUNTER — Encounter (INDEPENDENT_AMBULATORY_CARE_PROVIDER_SITE_OTHER): Payer: Self-pay

## 2014-01-04 VITALS — BP 122/90 | HR 76 | Resp 12 | Ht 72.0 in | Wt 166.0 lb

## 2014-01-04 DIAGNOSIS — K3580 Unspecified acute appendicitis: Secondary | ICD-10-CM

## 2014-01-04 NOTE — Progress Notes (Signed)
Dylan Parsons [birth year] [id number] 01/04/2014   History of Present Illness: Dylan Parsons is a  28 y.o. male who presents today status post lap appy by Dr. Jimmye NormanJames Wyatt.  Pathology reveals acute suppurative appendicitis.  The patient is tolerating a regular diet, having normal bowel movements, has good pain control.  He  is back to most normal activities.   Physical Exam: Abd: soft, nontender, active bowel sounds, nondistended.  All incisions are well healed.  Impression: 1.  Acute appendicitis, s/p lap appy  Plan: He  is able to return to normal activities. He  may follow up on a prn basis.

## 2014-01-04 NOTE — Patient Instructions (Signed)
Follow up as needed

## 2014-01-08 ENCOUNTER — Encounter (HOSPITAL_COMMUNITY): Payer: Self-pay | Admitting: Emergency Medicine

## 2014-01-08 ENCOUNTER — Emergency Department (HOSPITAL_COMMUNITY): Payer: 59

## 2014-01-08 ENCOUNTER — Inpatient Hospital Stay (HOSPITAL_COMMUNITY)
Admission: EM | Admit: 2014-01-08 | Discharge: 2014-01-11 | DRG: 372 | Disposition: A | Discharge status: Home / Self Care | Payer: 59 | Attending: Interventional Radiology | Admitting: Interventional Radiology

## 2014-01-08 DIAGNOSIS — T814XXA Infection following a procedure, initial encounter: Secondary | ICD-10-CM

## 2014-01-08 DIAGNOSIS — K651 Peritoneal abscess: Secondary | ICD-10-CM | POA: Diagnosis present

## 2014-01-08 DIAGNOSIS — K352 Acute appendicitis with generalized peritonitis, without abscess: Principal | ICD-10-CM | POA: Diagnosis present

## 2014-01-08 DIAGNOSIS — IMO0001 Reserved for inherently not codable concepts without codable children: Secondary | ICD-10-CM

## 2014-01-08 DIAGNOSIS — A0472 Enterocolitis due to Clostridium difficile, not specified as recurrent: Secondary | ICD-10-CM | POA: Diagnosis present

## 2014-01-08 DIAGNOSIS — K35209 Acute appendicitis with generalized peritonitis, without abscess, unspecified as to perforation: Principal | ICD-10-CM | POA: Diagnosis present

## 2014-01-08 LAB — CBC WITH DIFFERENTIAL/PLATELET
BASOS ABS: 0 10*3/uL (ref 0.0–0.1)
BASOS PCT: 0 % (ref 0–1)
EOS PCT: 1 % (ref 0–5)
Eosinophils Absolute: 0 10*3/uL (ref 0.0–0.7)
HEMATOCRIT: 41.2 % (ref 39.0–52.0)
HEMOGLOBIN: 14.4 g/dL (ref 13.0–17.0)
Lymphocytes Relative: 14 % (ref 12–46)
Lymphs Abs: 1.1 10*3/uL (ref 0.7–4.0)
MCH: 30.4 pg (ref 26.0–34.0)
MCHC: 35 g/dL (ref 30.0–36.0)
MCV: 87.1 fL (ref 78.0–100.0)
MONO ABS: 0.7 10*3/uL (ref 0.1–1.0)
Monocytes Relative: 8 % (ref 3–12)
Neutro Abs: 6.2 10*3/uL (ref 1.7–7.7)
Neutrophils Relative %: 77 % (ref 43–77)
Platelets: 325 10*3/uL (ref 150–400)
RBC: 4.73 MIL/uL (ref 4.22–5.81)
RDW: 12.2 % (ref 11.5–15.5)
WBC: 8.1 10*3/uL (ref 4.0–10.5)

## 2014-01-08 LAB — COMPREHENSIVE METABOLIC PANEL
ALBUMIN: 3.7 g/dL (ref 3.5–5.2)
ALT: 15 U/L (ref 0–53)
AST: 25 U/L (ref 0–37)
Alkaline Phosphatase: 87 U/L (ref 39–117)
BILIRUBIN TOTAL: 0.8 mg/dL (ref 0.3–1.2)
BUN: 7 mg/dL (ref 6–23)
CALCIUM: 9.6 mg/dL (ref 8.4–10.5)
CHLORIDE: 101 meq/L (ref 96–112)
CO2: 27 mEq/L (ref 19–32)
CREATININE: 0.84 mg/dL (ref 0.50–1.35)
GFR calc Af Amer: >90 mL/min (ref >90)
Glucose, Bld: 129 mg/dL — ABNORMAL HIGH (ref 70–99)
Potassium: 4.4 mEq/L (ref 3.7–5.3)
Sodium: 140 mEq/L (ref 137–147)
Total Protein: 7.5 g/dL (ref 6.0–8.3)

## 2014-01-08 LAB — URINALYSIS, ROUTINE W REFLEX MICROSCOPIC
Bilirubin Urine: NEGATIVE
GLUCOSE, UA: NEGATIVE mg/dL
Hgb urine dipstick: NEGATIVE
Ketones, ur: NEGATIVE mg/dL
LEUKOCYTES UA: NEGATIVE
Nitrite: NEGATIVE
PROTEIN: NEGATIVE mg/dL
Specific Gravity, Urine: 1.017 (ref 1.005–1.030)
UROBILINOGEN UA: 0.2 mg/dL (ref 0.0–1.0)
pH: 5.5 (ref 5.0–8.0)

## 2014-01-08 LAB — CLOSTRIDIUM DIFFICILE BY PCR: CDIFFPCR: POSITIVE — AB

## 2014-01-08 LAB — LIPASE, BLOOD: Lipase: 79 U/L — ABNORMAL HIGH (ref 11–59)

## 2014-01-08 MED ORDER — PIPERACILLIN-TAZOBACTAM 3.375 G IVPB
3.3750 g | Freq: Three times a day (TID) | INTRAVENOUS | Status: DC
  Administered 2014-01-08 – 2014-01-11 (×8): 3.375 g via INTRAVENOUS
  Filled 2014-01-08 (×10): qty 50

## 2014-01-08 MED ORDER — KCL IN DEXTROSE-NACL 20-5-0.45 MEQ/L-%-% IV SOLN
INTRAVENOUS | Status: DC
  Administered 2014-01-08 – 2014-01-09 (×2): via INTRAVENOUS
  Filled 2014-01-08 (×4): qty 1000

## 2014-01-08 MED ORDER — ONDANSETRON HCL 4 MG/2ML IJ SOLN: 4.0000 mg | Freq: Four times a day (QID) | INTRAMUSCULAR | Status: DC | PRN

## 2014-01-08 MED ORDER — PANTOPRAZOLE SODIUM 40 MG IV SOLR
40.0000 mg | Freq: Every day | INTRAVENOUS | Status: DC
  Administered 2014-01-08 – 2014-01-09 (×2): 40 mg via INTRAVENOUS
  Filled 2014-01-08 (×3): qty 40

## 2014-01-08 MED ORDER — SODIUM CHLORIDE 0.9 % IV BOLUS (SEPSIS)
1000.0000 mL | Freq: Once | INTRAVENOUS | Status: AC
  Administered 2014-01-08: 1000 mL via INTRAVENOUS

## 2014-01-08 MED ORDER — IOHEXOL 300 MG/ML  SOLN
100.0000 mL | Freq: Once | INTRAMUSCULAR | Status: AC | PRN
  Administered 2014-01-08: 100 mL via INTRAVENOUS

## 2014-01-08 MED ORDER — IOHEXOL 300 MG/ML  SOLN
50.0000 mL | Freq: Once | INTRAMUSCULAR | Status: AC | PRN
  Administered 2014-01-08: 50 mL via ORAL

## 2014-01-08 MED ORDER — HEPARIN SODIUM (PORCINE) 5000 UNIT/ML IJ SOLN
5000.0000 [IU] | Freq: Once | INTRAMUSCULAR | Status: AC
Start: 2014-01-08 — End: 2014-01-08
  Administered 2014-01-08: 5000 [IU] via SUBCUTANEOUS
  Filled 2014-01-08: qty 1

## 2014-01-08 MED ORDER — DIPHENHYDRAMINE HCL 12.5 MG/5ML PO ELIX: 12.5000 mg | ORAL_SOLUTION | Freq: Four times a day (QID) | ORAL | Status: DC | PRN

## 2014-01-08 MED ORDER — MORPHINE SULFATE 2 MG/ML IJ SOLN
1.0000 mg | INTRAMUSCULAR | Status: DC | PRN
  Administered 2014-01-09: 4 mg via INTRAVENOUS
  Administered 2014-01-09: 2 mg via INTRAVENOUS
  Filled 2014-01-08: qty 2
  Filled 2014-01-08: qty 1

## 2014-01-08 MED ORDER — PIPERACILLIN-TAZOBACTAM 3.375 G IVPB 30 MIN
3.3750 g | Freq: Once | INTRAVENOUS | Status: AC
  Administered 2014-01-08: 3.375 g via INTRAVENOUS
  Filled 2014-01-08: qty 50

## 2014-01-08 MED ORDER — DIPHENHYDRAMINE HCL 50 MG/ML IJ SOLN: 12.5000 mg | Freq: Four times a day (QID) | INTRAMUSCULAR | Status: DC | PRN

## 2014-01-08 MED ORDER — METRONIDAZOLE 500 MG PO TABS
500.0000 mg | ORAL_TABLET | Freq: Three times a day (TID) | ORAL | Status: DC
  Administered 2014-01-08 – 2014-01-11 (×8): 500 mg via ORAL
  Filled 2014-01-08 (×12): qty 1

## 2014-01-08 MED ORDER — HYDROMORPHONE HCL PF 1 MG/ML IJ SOLN
0.5000 mg | Freq: Once | INTRAMUSCULAR | Status: AC
  Administered 2014-01-08: 0.5 mg via INTRAVENOUS
  Filled 2014-01-08: qty 1

## 2014-01-08 NOTE — ED Notes (Signed)
Patient returned from CT

## 2014-01-08 NOTE — ED Notes (Signed)
Patient transported to CT 

## 2014-01-08 NOTE — H&P (Signed)
Dylan Parsons is an 28 y.o. male.   Chief Complaint: abdominal pain HPI: 28 yo wm s/p lap appy for ruptured appendicitis 5/30 comes in complaining of abd pain, fatigue, subjective fever and chills. Was doing well. Noticed on Friday morning that he had pain in upper abd that caused him to double over after eating part of apple and fiber bar. Resolved after awhile. Was able to eat lunch. On way home from work became very fatigue, cold, and abd pain returned. Took ibuprofen and went to bed. This am, pain had returned to upper and lower abdomen. crampy and constant. Been having frequent loose stools and some discomfort when urinating. Was on broad spectrum abx after surgery due to cellulitis.    Brief history; The patient was admitted and taken to the operating room where he underwent a lap appy on May 30. His appendix was perforated during removal. On POD 1, the patient was doing ok. He was given clear liquids and continues on his IV Invanz. On POD 2, he was feeling better and passing flatus. His diet was advanced as tolerated. He was noted though to have a significant cellulitis of his LLQ and left flank. Vancomycin was added for additional gram positive coverage. It improved, but after 2 days began to extend down his left thigh. His abx therapy was changed to Vanc, clinda, and ancef at the recommendation of ID. His cellulitis rapidly improved after this transition. On POD 5, his cellulitis was significantly improved and he was otherwise stable for dc home.  Past Medical History  Diagnosis Date  . Complication of anesthesia     "anesthesia wears off rapidly"  . Family history of anesthesia complication     "anesthesia wears off rapidly for my mom"  . Depression     Past Surgical History  Procedure Laterality Date  . Laparoscopic appendectomy N/A 12/18/2013    Procedure: APPENDECTOMY LAPAROSCOPIC;  Surgeon: Gwenyth Ober, MD;  Location: [place];  Service: General;  Laterality: N/A;  . Appendectomy      . Wisdom tooth extraction  2000's    "all 4"    History reviewed. No pertinent family history. Social History:  reports that he has never smoked. He has never used smokeless tobacco. He reports that he drinks about 4.2 ounces of alcohol per week. He reports that he does not use illicit drugs.  Allergies: No Known Allergies   (Not in a hospital admission)  Results for orders placed during the hospital encounter of 01/08/14 (from the past 48 hour(s))  CBC WITH DIFFERENTIAL     Status: None   Collection Time    01/08/14 12:25 PM      Result Value Ref Range   WBC 8.1  4.0 - 10.5 K/uL   RBC 4.73  4.22 - 5.81 MIL/uL   Hemoglobin 14.4  13.0 - 17.0 g/dL   HCT 41.2  39.0 - 52.0 %   MCV 87.1  78.0 - 100.0 fL   MCH 30.4  26.0 - 34.0 pg   MCHC 35.0  30.0 - 36.0 g/dL   RDW 12.2  11.5 - 15.5 %   Platelets 325  150 - 400 K/uL   Neutrophils Relative % 77  43 - 77 %   Neutro Abs 6.2  1.7 - 7.7 K/uL   Lymphocytes Relative 14  12 - 46 %   Lymphs Abs 1.1  0.7 - 4.0 K/uL   Monocytes Relative 8  3 - 12 %   Monocytes Absolute  0.7  0.1 - 1.0 K/uL   Eosinophils Relative 1  0 - 5 %   Eosinophils Absolute 0.0  0.0 - 0.7 K/uL   Basophils Relative 0  0 - 1 %   Basophils Absolute 0.0  0.0 - 0.1 K/uL  COMPREHENSIVE METABOLIC PANEL     Status: Abnormal   Collection Time    01/08/14 12:25 PM      Result Value Ref Range   Sodium 140  137 - 147 mEq/L   Potassium 4.4  3.7 - 5.3 mEq/L   Chloride 101  96 - 112 mEq/L   CO2 27  19 - 32 mEq/L   Glucose, Bld 129 (*) 70 - 99 mg/dL   BUN 7  6 - 23 mg/dL   Creatinine, Ser 0.84  0.50 - 1.35 mg/dL   Calcium 9.6  8.4 - 10.5 mg/dL   Total Protein 7.5  6.0 - 8.3 g/dL   Albumin 3.7  3.5 - 5.2 g/dL   AST 25  0 - 37 U/L   ALT 15  0 - 53 U/L   Alkaline Phosphatase 87  39 - 117 U/L   Total Bilirubin 0.8  0.3 - 1.2 mg/dL   GFR calc non Af Amer >90  >90 mL/min   GFR calc Af Amer >90  >90 mL/min   Comment: (NOTE)     The eGFR has been calculated using the CKD EPI  equation.     This calculation has not been validated in all clinical situations.     eGFR's persistently <90 mL/min signify possible Chronic Kidney     Disease.  LIPASE, BLOOD     Status: Abnormal   Collection Time    01/08/14 12:25 PM      Result Value Ref Range   Lipase 79 (*) 11 - 59 U/L  URINALYSIS, ROUTINE W REFLEX MICROSCOPIC     Status: None   Collection Time    01/08/14 12:49 PM      Result Value Ref Range   Color, Urine YELLOW  YELLOW   APPearance CLEAR  CLEAR   Specific Gravity, Urine 1.017  1.005 - 1.030   pH 5.5  5.0 - 8.0   Glucose, UA NEGATIVE  NEGATIVE mg/dL   Hgb urine dipstick NEGATIVE  NEGATIVE   Bilirubin Urine NEGATIVE  NEGATIVE   Ketones, ur NEGATIVE  NEGATIVE mg/dL   Protein, ur NEGATIVE  NEGATIVE mg/dL   Urobilinogen, UA 0.2  0.0 - 1.0 mg/dL   Nitrite NEGATIVE  NEGATIVE   Leukocytes, UA NEGATIVE  NEGATIVE   Comment: MICROSCOPIC NOT DONE ON URINES WITH NEGATIVE PROTEIN, BLOOD, LEUKOCYTES, NITRITE, OR GLUCOSE <1000 mg/dL.   Ct Abdomen Pelvis W Contrast  01/08/2014   CLINICAL DATA:  Severe abdominal pain radiating to the back since last night. Fever. Loose stools. History of appendectomy 3 weeks ago.  EXAM: CT ABDOMEN AND PELVIS WITH CONTRAST  TECHNIQUE: Multidetector CT imaging of the abdomen and pelvis was performed using the standard protocol following bolus administration of intravenous contrast.  CONTRAST:  131m OMNIPAQUE IOHEXOL 300 MG/ML SOLN, 575mOMNIPAQUE IOHEXOL 300 MG/ML SOLN  COMPARISON:  CT of the abdomen and pelvis 12/18/2013.  FINDINGS: Lung Bases: Unremarkable.  Abdomen/Pelvis: In the central aspect of the anatomic pelvis immediately anterior to the proximal rectum and inferior to the distal sigmoid colon there is an irregular-shaped rim enhancing collection of fluid compatible with an abscess. The largest bulk of this collection is demonstrated on image 77 of series 2 and  image 74 of series 5 where it measures approximately 4.1 x 5.8 x 3.9 cm,  however, there is a portion of this collection which extends anteriorly best appreciated on images 75 and 76 of series 2. The adjacent colonic and rectal walls appear thickened, presumably reactive to the adjacent infected fluid collection. No other definite collections are identified in the abdomen or pelvis. No significant volume of ascites. No pneumoperitoneum. No pathologic distention of small bowel or colon.  The appearance of the liver, gallbladder, pancreas, spleen, bilateral adrenal glands and bilateral kidneys is unremarkable. There is some very mild fullness of the right ureter, presumably related to some mild extrinsic compression upon the distal third of the right ureter which comes in close proximity to a portion of the rim enhancing fluid collection in the pelvis. Prostate gland and urinary bladder are unremarkable in appearance.  Musculoskeletal: There are no aggressive appearing lytic or blastic lesions noted in the visualized portions of the skeleton.  IMPRESSION: 1. Large rim enhancing fluid collection in the central pelvis compatible with an abscess, as detailed above. 2. The adjacent loops of distal sigmoid colon and proximal rectum are markedly thickened, presumably reactive to the adjacent infected fluid collection. 3. In addition, there is some fullness of the right ureter (without frank hydroureteronephrosis), related to extrinsic compression upon the distal third of the right ureter by the pelvic abscess. These results were called by telephone at the time of interpretation on 01/08/2014 at 3:42 PM to [place], who verbally acknowledged these results.   Electronically Signed   By: Vinnie Langton M.D.   On: 01/08/2014 15:45    Review of Systems  Constitutional: Positive for fever, chills and malaise/fatigue. Negative for diaphoresis.  HENT: Negative for hearing loss and nosebleeds.   Eyes: Negative for blurred vision and double vision.  Respiratory: Negative for sputum  production and shortness of breath.   Cardiovascular: Negative for chest pain, palpitations and orthopnea.  Gastrointestinal: Positive for nausea, abdominal pain and diarrhea. Negative for blood in stool and melena.  Genitourinary: Positive for urgency and frequency.  Musculoskeletal: Negative for myalgias and neck pain.  Neurological: Negative for dizziness, seizures and weakness.  Psychiatric/Behavioral: Negative for substance abuse.    Blood pressure 117/67, pulse 88, temperature 98.1 F (36.7 C), temperature source Oral, resp. rate 21, height 6' (1.829 m), weight 164 lb 3 oz (74.475 kg), SpO2 99.00%. Physical Exam  Vitals reviewed. Constitutional: He is oriented to person, place, and time. He appears well-developed and well-nourished. No distress.  HENT:  Head: Normocephalic and atraumatic.  Right Ear: External ear normal.  Left Ear: External ear normal.  Eyes: Conjunctivae are normal. No scleral icterus.  Neck: Normal range of motion. Neck supple. No tracheal deviation present. No thyromegaly present.  Cardiovascular: Normal rate and normal heart sounds.   Respiratory: Effort normal and breath sounds normal. No stridor. No respiratory distress. He has no wheezes.  GI: Soft. He exhibits no distension. There is tenderness in the suprapubic area. There is no rigidity, no rebound and no guarding. No hernia.    Soft, nd, healed trocar sites; mainly TTP in suprapubic/LLQ; some mild TTP in upper midline. No RT/peritonitis.   Musculoskeletal: He exhibits no edema and no tenderness.  Lymphadenopathy:    He has no cervical adenopathy.  Neurological: He is alert and oriented to person, place, and time. He exhibits normal muscle tone.  Skin: Skin is warm and dry. No rash noted. He is not diaphoretic. No erythema. No pallor.  Psychiatric: He has a normal mood and affect. His behavior is normal. Judgment and thought content normal.     Assessment/Plan Intra-abdominal abscess Diarrhea S/p  Lap appy from ruptured appendicitis 5/30  -admit -iv abx -perc drain fluid collection -check c diff  No fever, tachycardia, or wbc so this fluid collection may be sterile but believe it needs perc drain since causing syptoms - pain, ureteral dilation, surrounding inflammation Loose stool could be simply reactive due to colonic inflammation. But will r/o c diff since he was on broad spectrum abx   Greer Pickerel M 01/08/2014, 4:40 PM

## 2014-01-08 NOTE — ED Provider Notes (Signed)
CSN: [id number]     Arrival date & time 01/08/14  1202 History   First MD Initiated Contact with Patient 01/08/14 1212     Chief Complaint  Patient presents with  . Abdominal Pain     (Consider location/radiation/quality/duration/timing/severity/associated sxs/prior Treatment) Patient is a 28 y.o. male presenting with abdominal pain. The history is provided by the patient. No language interpreter was used.  Abdominal Pain Associated symptoms: diarrhea and fever (Subjective)   Associated symptoms: no chest pain, no chills, no constipation, no nausea, no shortness of breath and no vomiting   Dylan Parsons is a 28 year old male with past medical history depression presenting to the ED with abdominal pain that started yesterday morning. As per patient, reported that the pain is localized to the epigastric region and lower quadrants of the abdomen described as a underlying dull pain that is constant with intermittent sharp pain that is worse with motion with radiation towards the back and chest. Stated that he's been having a bowel movement every hour that is described as being "soupy" of the orange/brownish color. Reported that he was feeling feverish yesterday with intermittent chills and sweating. Stated he took an ibuprofen with minimal relief. Reported that he was seen and assessed by surgery as outpatient on Tuesday when he was cleared and discharged. Denied chest pain, shortness of breath, difficulty breathing, melena, hematochezia, nausea, vomiting, urinary symptoms, hematuria, dysuria. PCP Dr. Tiburcio Pea  Past Medical History  Diagnosis Date  . Complication of anesthesia     "anesthesia wears off rapidly"  . Family history of anesthesia complication     "anesthesia wears off rapidly for my mom"  . Depression    Past Surgical History  Procedure Laterality Date  . Laparoscopic appendectomy N/A 12/18/2013    Procedure: APPENDECTOMY LAPAROSCOPIC;  Surgeon: Cherylynn Ridges, MD;  Location: [facility] OR;   Service: General;  Laterality: N/A;  . Appendectomy    . Wisdom tooth extraction  2000's    "all 4"   History reviewed. No pertinent family history. History  Substance Use Topics  . Smoking status: Never Smoker   . Smokeless tobacco: Never Used  . Alcohol Use: 4.2 oz/week    7 Cans of beer per week    Review of Systems  Constitutional: Positive for fever (Subjective). Negative for chills.  Respiratory: Negative for chest tightness and shortness of breath.   Cardiovascular: Negative for chest pain.  Gastrointestinal: Positive for abdominal pain and diarrhea. Negative for nausea, vomiting, constipation, blood in stool and anal bleeding.  Musculoskeletal: Positive for back pain.  Neurological: Negative for weakness.      Allergies  Review of patient's allergies indicates no known allergies.  Home Medications   Prior to Admission medications   Medication Sig Start Date End Date Taking? Authorizing Provider  ibuprofen (ADVIL,MOTRIN) 200 MG tablet Take 600 mg by mouth every 6 (six) hours as needed for moderate pain.   Yes Historical Provider, MD  valACYclovir (VALTREX) 500 MG tablet Take 500 mg by mouth 2 (two) times a week. Monday and Friday   Yes Historical Provider, MD   BP 117/67  Pulse 88  Temp(Src) 98.1 F (36.7 C) (Oral)  Resp 21  Ht 6' (1.829 m)  Wt 164 lb 3 oz (74.475 kg)  BMI 22.26 kg/m2  SpO2 99% Physical Exam  Nursing note and vitals reviewed. Constitutional: He is oriented to person, place, and time. He appears well-developed and well-nourished. No distress.  HENT:  Head: Normocephalic and atraumatic.  Mouth/Throat: Oropharynx is clear and moist. No oropharyngeal exudate.  Eyes: Conjunctivae and EOM are normal. Pupils are equal, round, and reactive to light. Right eye exhibits no discharge. Left eye exhibits no discharge.  Neck: Normal range of motion. Neck supple. No tracheal deviation present.  Cardiovascular: Normal rate, regular rhythm and normal heart  sounds.  Exam reveals no friction rub.   No murmur heard. Pulses:      Radial pulses are 2+ on the right side, and 2+ on the left side.       Dorsalis pedis pulses are 2+ on the right side, and 2+ on the left side.  Pulmonary/Chest: Effort normal and breath sounds normal. No respiratory distress. He has no wheezes. He has no rales.  Patient is able to speak in full sentences without difficulty Negative use of accessory muscles Negative stridor  Abdominal: Soft. Bowel sounds are normal. He exhibits no distension. There is tenderness. There is no rebound and no guarding.  Negative abdominal distension noted BS normoactive in all 4 quadrants Tenderness upon palpation to suprapubic and left lower quadrant region as well as right lower quadrant with voluntary guarding Negative rigidity Negative peritoneal signs  Musculoskeletal: Normal range of motion.  Lymphadenopathy:    He has no cervical adenopathy.  Neurological: He is alert and oriented to person, place, and time. No cranial nerve deficit. He exhibits normal muscle tone. Coordination normal.  Skin: Skin is warm and dry. No rash noted. He is not diaphoretic. No erythema.  Psychiatric: He has a normal mood and affect. His behavior is normal. Thought content normal.    ED Course  Procedures (including critical care time)  4:16 PM This provider spoke with Dr. Andrey Campanile, General Surgery - discussed case, history, labs, imaging in great detail. Physician to come and see patient.   4:52 PM Dr. Andrey Campanile at bedside assessing patient.   Results for orders placed during the hospital encounter of 01/08/14  CBC WITH DIFFERENTIAL      Result Value Ref Range   WBC 8.1  4.0 - 10.5 K/uL   RBC 4.73  4.22 - 5.81 MIL/uL   Hemoglobin 14.4  13.0 - 17.0 g/dL   HCT 40.9  81.1 - 91.4 %   MCV 87.1  78.0 - 100.0 fL   MCH 30.4  26.0 - 34.0 pg   MCHC 35.0  30.0 - 36.0 g/dL   RDW 78.2  95.6 - 21.3 %   Platelets 325  150 - 400 K/uL   Neutrophils Relative % 77   43 - 77 %   Neutro Abs 6.2  1.7 - 7.7 K/uL   Lymphocytes Relative 14  12 - 46 %   Lymphs Abs 1.1  0.7 - 4.0 K/uL   Monocytes Relative 8  3 - 12 %   Monocytes Absolute 0.7  0.1 - 1.0 K/uL   Eosinophils Relative 1  0 - 5 %   Eosinophils Absolute 0.0  0.0 - 0.7 K/uL   Basophils Relative 0  0 - 1 %   Basophils Absolute 0.0  0.0 - 0.1 K/uL  COMPREHENSIVE METABOLIC PANEL      Result Value Ref Range   Sodium 140  137 - 147 mEq/L   Potassium 4.4  3.7 - 5.3 mEq/L   Chloride 101  96 - 112 mEq/L   CO2 27  19 - 32 mEq/L   Glucose, Bld 129 (*) 70 - 99 mg/dL   BUN 7  6 - 23 mg/dL   Creatinine, Ser  0.84  0.50 - 1.35 mg/dL   Calcium 9.6  8.4 - 40.910.5 mg/dL   Total Protein 7.5  6.0 - 8.3 g/dL   Albumin 3.7  3.5 - 5.2 g/dL   AST 25  0 - 37 U/L   ALT 15  0 - 53 U/L   Alkaline Phosphatase 87  39 - 117 U/L   Total Bilirubin 0.8  0.3 - 1.2 mg/dL   GFR calc non Af Amer >90  >90 mL/min   GFR calc Af Amer >90  >90 mL/min  URINALYSIS, ROUTINE W REFLEX MICROSCOPIC      Result Value Ref Range   Color, Urine YELLOW  YELLOW   APPearance CLEAR  CLEAR   Specific Gravity, Urine 1.017  1.005 - 1.030   pH 5.5  5.0 - 8.0   Glucose, UA NEGATIVE  NEGATIVE mg/dL   Hgb urine dipstick NEGATIVE  NEGATIVE   Bilirubin Urine NEGATIVE  NEGATIVE   Ketones, ur NEGATIVE  NEGATIVE mg/dL   Protein, ur NEGATIVE  NEGATIVE mg/dL   Urobilinogen, UA 0.2  0.0 - 1.0 mg/dL   Nitrite NEGATIVE  NEGATIVE   Leukocytes, UA NEGATIVE  NEGATIVE  LIPASE, BLOOD      Result Value Ref Range   Lipase 79 (*) 11 - 59 U/L    Labs Review Labs Reviewed  COMPREHENSIVE METABOLIC PANEL - Abnormal; Notable for the following:    Glucose, Bld 129 (*)    All other components within normal limits  LIPASE, BLOOD - Abnormal; Notable for the following:    Lipase 79 (*)    All other components within normal limits  CBC WITH DIFFERENTIAL  URINALYSIS, ROUTINE W REFLEX MICROSCOPIC    Imaging Review Ct Abdomen Pelvis W Contrast  01/08/2014    CLINICAL DATA:  Severe abdominal pain radiating to the back since last night. Fever. Loose stools. History of appendectomy 3 weeks ago.  EXAM: CT ABDOMEN AND PELVIS WITH CONTRAST  TECHNIQUE: Multidetector CT imaging of the abdomen and pelvis was performed using the standard protocol following bolus administration of intravenous contrast.  CONTRAST:  100mL OMNIPAQUE IOHEXOL 300 MG/ML SOLN, 50mL OMNIPAQUE IOHEXOL 300 MG/ML SOLN  COMPARISON:  CT of the abdomen and pelvis 12/18/2013.  FINDINGS: Lung Bases: Unremarkable.  Abdomen/Pelvis: In the central aspect of the anatomic pelvis immediately anterior to the proximal rectum and inferior to the distal sigmoid colon there is an irregular-shaped rim enhancing collection of fluid compatible with an abscess. The largest bulk of this collection is demonstrated on image 77 of series 2 and image 74 of series 5 where it measures approximately 4.1 x 5.8 x 3.9 cm, however, there is a portion of this collection which extends anteriorly best appreciated on images 75 and 76 of series 2. The adjacent colonic and rectal walls appear thickened, presumably reactive to the adjacent infected fluid collection. No other definite collections are identified in the abdomen or pelvis. No significant volume of ascites. No pneumoperitoneum. No pathologic distention of small bowel or colon.  The appearance of the liver, gallbladder, pancreas, spleen, bilateral adrenal glands and bilateral kidneys is unremarkable. There is some very mild fullness of the right ureter, presumably related to some mild extrinsic compression upon the distal third of the right ureter which comes in close proximity to a portion of the rim enhancing fluid collection in the pelvis. Prostate gland and urinary bladder are unremarkable in appearance.  Musculoskeletal: There are no aggressive appearing lytic or blastic lesions noted in the visualized portions of  the skeleton.  IMPRESSION: 1. Large rim enhancing fluid collection  in the central pelvis compatible with an abscess, as detailed above. 2. The adjacent loops of distal sigmoid colon and proximal rectum are markedly thickened, presumably reactive to the adjacent infected fluid collection. 3. In addition, there is some fullness of the right ureter (without frank hydroureteronephrosis), related to extrinsic compression upon the distal third of the right ureter by the pelvic abscess. These results were called by telephone at the time of interpretation on 01/08/2014 at 3:42 PM to PA [facility] LL[facility]ISSA SCIACCA, who verbally acknowledged these results.   Electronically Signed   By: Trudie Reedaniel  Entrikin M.D.   On: 01/08/2014 15:45     EKG Interpretation None      MDM   Final diagnoses:  Intra-abdominal abscess post-procedure, initial encounter    Medications  HYDROmorphone (DILAUDID) injection 0.5 mg (not administered)  piperacillin-tazobactam (ZOSYN) IVPB 3.375 g (3.375 g Intravenous New Bag/Given 01/08/14 1624)  piperacillin-tazobactam (ZOSYN) IVPB 3.375 g (not administered)  sodium chloride 0.9 % bolus 1,000 mL (0 mLs Intravenous Stopped 01/08/14 1407)  iohexol (OMNIPAQUE) 300 MG/ML solution 50 mL (50 mLs Oral Contrast Given 01/08/14 1315)  iohexol (OMNIPAQUE) 300 MG/ML solution 100 mL (100 mLs Intravenous Contrast Given 01/08/14 1510)   Filed Vitals:   01/08/14 1400 01/08/14 1500 01/08/14 1530 01/08/14 1600  BP: 127/71 125/67 126/73 117/67  Pulse: 77 78 79 88  Temp:      TempSrc:      Resp: 17 13 16 21   Height:      Weight:      SpO2: 100% 99% 99% 99%   This provider reviewed patient's chart. Patient was seen and assessed in ED setting on 12/18/2013 and was diagnosed with acute appendicitis is noted on CT abdomen pelvis with contrast. Patient was admitted to the hospital and had a laparoscopic appendectomy performed-during surgery it was noted that the appendix was perforated. Patient was started on IV antibiotics. Postop day 2 patient started to develop a cellulitis  localize the left lower quadrant radiating down to the left thigh-patient was again started on IV antibiotics. Postop day 5 patient was discharged home with antibiotics with close outpatient followup surgery. CBC negative elevated white blood cell count-negative left shift or leukocytosis noted. CMP negative elevation of liver enzymes, negative elevated alkaline phosphatase of bilirubin. BUN and creatinine normal limits. Electrolytes properly balanced. Lipase mild elevation at 79. Urinalysis negative hemoglobin, nitrites, leukocytes identified. CT abdomen and pelvis with contrast identified a large rim-enhancing fluid collection in the central pelvis compatible with an abscess-adjacent loops of the distal sigmoid colon and proximal rectum are markedly thickened presumably reactive to the infected fluid collection. There is some fullness in the right ureter. Discussed case with Dr. Andrey CampanileWilson, general surgery regarding abscess identified to the central pelvic region that measures approximately 4.1 x 5.8 x 3.9 cm. Patient started on IV antibiotics while in ED setting. Patient stable-not septic appearing-no fever, hypotension, elevated white blood cell count identified. Patient appears well.  AGCO CorporationMarissa Sciacca, PA-C 01/08/14 2250

## 2014-01-08 NOTE — ED Notes (Addendum)
Pt is 3 weeks post op appendectomy. Last night he began to have severe abd pain radiating into his back and frequent soft stool. He thinks he had a fever last night also. He states the surgery went well and he was released from surgeons care after a normal check up in office this week

## 2014-01-08 NOTE — ED Notes (Signed)
Refuses pain medication at this time; states he can bear it and does not want medication.

## 2014-01-08 NOTE — Progress Notes (Signed)
Call from lab, pt positive for C-diff.  MD made aware, new orders placed.

## 2014-01-08 NOTE — Progress Notes (Signed)
ANTIBIOTIC CONSULT NOTE - INITIAL  Pharmacy Consult for zosyn Indication: Intra-abdominal Infection (pelvic abscess)   No Known Allergies  Patient Measurements: Height: 6' (182.9 cm) Weight: 164 lb 3 oz (74.475 kg) IBW/kg (Calculated) : 77.6   Vital Signs: Temp: 98.1 F (36.7 C) (06/20 1206) Temp src: Oral (06/20 1206) BP: 126/73 mmHg (06/20 1530) Pulse Rate: 79 (06/20 1530) Intake/Output from previous day:   Intake/Output from this shift:    Labs:  Recent Labs  01/08/14 1225  WBC 8.1  HGB 14.4  PLT 325  CREATININE 0.84   Estimated Creatinine Clearance: 140.4 ml/min (by C-G formula based on Cr of 0.84). No results found for this basename: VANCOTROUGH, VANCOPEAK, VANCORANDOM, GENTTROUGH, GENTPEAK, GENTRANDOM, TOBRATROUGH, TOBRAPEAK, TOBRARND, AMIKACINPEAK, AMIKACINTROU, AMIKACIN,  in the last 72 hours   Microbiology: No results found for this or any previous visit (from the past 720 hour(s)).  Medical History: Past Medical History  Diagnosis Date  . Complication of anesthesia     "anesthesia wears off rapidly"  . Family history of anesthesia complication     "anesthesia wears off rapidly for my mom"  . Depression     Medications:   see med rec  Assessment: Patient is a 28 y.o s/p appendectomy ~3 weeks ago.  CT abdomen with noted "Large rim enhancing fluid collection in the central pelvis compatible with an abscess."  To start zosyn for pelvic abscess.  Plan:  1) zosyn 3.375gm IV x1 over 30 minutes, then 3.375gm IV q8h (over 4 hours)  Pham, Anh P 01/08/2014,3:58 PM

## 2014-01-09 ENCOUNTER — Encounter (HOSPITAL_COMMUNITY): Payer: Self-pay | Admitting: *Deleted

## 2014-01-09 ENCOUNTER — Inpatient Hospital Stay (HOSPITAL_COMMUNITY): Payer: 59

## 2014-01-09 LAB — COMPREHENSIVE METABOLIC PANEL
ALBUMIN: 3.2 g/dL — AB (ref 3.5–5.2)
ALK PHOS: 72 U/L (ref 39–117)
ALT: 9 U/L (ref 0–53)
AST: 13 U/L (ref 0–37)
BILIRUBIN TOTAL: 0.8 mg/dL (ref 0.3–1.2)
BUN: 5 mg/dL — ABNORMAL LOW (ref 6–23)
CHLORIDE: 101 meq/L (ref 96–112)
CO2: 27 meq/L (ref 19–32)
Calcium: 9.2 mg/dL (ref 8.4–10.5)
Creatinine, Ser: 1.08 mg/dL (ref 0.50–1.35)
GFR calc Af Amer: >90 mL/min (ref >90)
Glucose, Bld: 94 mg/dL (ref 70–99)
Potassium: 4.3 mEq/L (ref 3.7–5.3)
Sodium: 139 mEq/L (ref 137–147)
Total Protein: 6.6 g/dL (ref 6.0–8.3)

## 2014-01-09 LAB — MAGNESIUM: Magnesium: 1.9 mg/dL (ref 1.5–2.5)

## 2014-01-09 LAB — CBC
HEMATOCRIT: 38 % — AB (ref 39.0–52.0)
Hemoglobin: 13.3 g/dL (ref 13.0–17.0)
MCH: 30.4 pg (ref 26.0–34.0)
MCHC: 35 g/dL (ref 30.0–36.0)
MCV: 87 fL (ref 78.0–100.0)
Platelets: 258 10*3/uL (ref 150–400)
RBC: 4.37 MIL/uL (ref 4.22–5.81)
RDW: 12.3 % (ref 11.5–15.5)
WBC: 6.6 10*3/uL (ref 4.0–10.5)

## 2014-01-09 LAB — PHOSPHORUS: Phosphorus: 4.2 mg/dL (ref 2.3–4.6)

## 2014-01-09 MED ORDER — MIDAZOLAM HCL 2 MG/2ML IJ SOLN
INTRAMUSCULAR | Status: AC
  Filled 2014-01-09: qty 6

## 2014-01-09 MED ORDER — FENTANYL CITRATE 0.05 MG/ML IJ SOLN
INTRAMUSCULAR | Status: AC
  Filled 2014-01-09: qty 4

## 2014-01-09 MED ORDER — HYDROCODONE-ACETAMINOPHEN 5-325 MG PO TABS
1.0000 | ORAL_TABLET | ORAL | Status: DC | PRN
  Administered 2014-01-09: 2 via ORAL
  Filled 2014-01-09: qty 2

## 2014-01-09 MED ORDER — LIDOCAINE HCL 1 % IJ SOLN
INTRAMUSCULAR | Status: AC
  Filled 2014-01-09: qty 10

## 2014-01-09 MED ORDER — HYDROMORPHONE HCL PF 1 MG/ML IJ SOLN
1.0000 mg | INTRAMUSCULAR | Status: DC | PRN
  Administered 2014-01-09 – 2014-01-10 (×3): 2 mg via INTRAVENOUS
  Filled 2014-01-09 (×3): qty 2

## 2014-01-09 MED ORDER — MIDAZOLAM HCL 2 MG/2ML IJ SOLN
INTRAMUSCULAR | Status: AC | PRN
  Administered 2014-01-09: 1 mg via INTRAVENOUS
  Administered 2014-01-09 (×2): 2 mg via INTRAVENOUS
  Administered 2014-01-09: 1 mg via INTRAVENOUS

## 2014-01-09 MED ORDER — FENTANYL CITRATE 0.05 MG/ML IJ SOLN
INTRAMUSCULAR | Status: AC | PRN
  Administered 2014-01-09: 25 ug via INTRAVENOUS
  Administered 2014-01-09: 50 ug via INTRAVENOUS
  Administered 2014-01-09: 100 ug via INTRAVENOUS
  Administered 2014-01-09: 25 ug via INTRAVENOUS

## 2014-01-09 NOTE — Progress Notes (Signed)
Subjective: Stable and lowered. Complains of suprapubic tenderness. No nausea or vomiting. Currently n.p.o. For our procedure. Temp. 97.9. Heart rate 65.    WBC 6600. Creatinine 1.08. Potassium 4.3. Glucose 94.  C. Difficile PCR positive.  Objective: Vital signs in last 24 hours: Temp:  [97.9 F (36.6 C)-98.7 F (37.1 C)] 97.9 F (36.6 C) (06/21 0645) Pulse Rate:  [65-95] 65 (06/21 0645) Resp:  [13-22] 18 (06/21 0645) BP: (109-136)/(65-91) 118/66 mmHg (06/21 0645) SpO2:  [95 %-100 %] 100 % (06/21 0645) Weight:  [164 lb 3 oz (74.475 kg)] 164 lb 3 oz (74.475 kg) (06/20 1206) Last BM Date: 01/08/14  Intake/Output from previous day:   Intake/Output this shift:    General appearance: alert. Oriented. Mental status normal. No significant distress. Resp: clear to auscultation bilaterally GI: abdomen soft. Nondistended. Tender with guarding suprapubic area.  Lab Results:   Recent Labs  01/08/14 1225 01/09/14 0305  WBC 8.1 6.6  HGB 14.4 13.3  HCT 41.2 38.0*  PLT 325 258   BMET  Recent Labs  01/08/14 1225 01/09/14 0305  NA 140 139  K 4.4 4.3  CL 101 101  CO2 27 27  GLUCOSE 129* 94  BUN 7 5*  CREATININE 0.84 1.08  CALCIUM 9.6 9.2   PT/INR No results found for this basename: LABPROT, INR,  in the last 72 hours ABG No results found for this basename: PHART, PCO2, PO2, HCO3,  in the last 72 hours  Studies/Results: Ct Abdomen Pelvis W Contrast  01/08/2014   CLINICAL DATA:  Severe abdominal pain radiating to the back since last night. Fever. Loose stools. History of appendectomy 3 weeks ago.  EXAM: CT ABDOMEN AND PELVIS WITH CONTRAST  TECHNIQUE: Multidetector CT imaging of the abdomen and pelvis was performed using the standard protocol following bolus administration of intravenous contrast.  CONTRAST:  100mL OMNIPAQUE IOHEXOL 300 MG/ML SOLN, 50mL OMNIPAQUE IOHEXOL 300 MG/ML SOLN  COMPARISON:  CT of the abdomen and pelvis 12/18/2013.  FINDINGS: Lung Bases:  Unremarkable.  Abdomen/Pelvis: In the central aspect of the anatomic pelvis immediately anterior to the proximal rectum and inferior to the distal sigmoid colon there is an irregular-shaped rim enhancing collection of fluid compatible with an abscess. The largest bulk of this collection is demonstrated on image 77 of series 2 and image 74 of series 5 where it measures approximately 4.1 x 5.8 x 3.9 cm, however, there is a portion of this collection which extends anteriorly best appreciated on images 75 and 76 of series 2. The adjacent colonic and rectal walls appear thickened, presumably reactive to the adjacent infected fluid collection. No other definite collections are identified in the abdomen or pelvis. No significant volume of ascites. No pneumoperitoneum. No pathologic distention of small bowel or colon.  The appearance of the liver, gallbladder, pancreas, spleen, bilateral adrenal glands and bilateral kidneys is unremarkable. There is some very mild fullness of the right ureter, presumably related to some mild extrinsic compression upon the distal third of the right ureter which comes in close proximity to a portion of the rim enhancing fluid collection in the pelvis. Prostate gland and urinary bladder are unremarkable in appearance.  Musculoskeletal: There are no aggressive appearing lytic or blastic lesions noted in the visualized portions of the skeleton.  IMPRESSION: 1. Large rim enhancing fluid collection in the central pelvis compatible with an abscess, as detailed above. 2. The adjacent loops of distal sigmoid colon and proximal rectum are markedly thickened, presumably reactive to the adjacent infected  fluid collection. 3. In addition, there is some fullness of the right ureter (without frank hydroureteronephrosis), related to extrinsic compression upon the distal third of the right ureter by the pelvic abscess. These results were called by telephone at the time of interpretation on 01/08/2014 at 3:42  PM to [facility], who verbally acknowledged these results.   Electronically Signed   By: Trudie Reedaniel  Entrikin Parsons.D.   On: 01/08/2014 15:45    Anti-infectives: Anti-infectives   Start     Dose/Rate Route Frequency Ordered Stop   01/08/14 2200  piperacillin-tazobactam (ZOSYN) IVPB 3.375 g     3.375 g 12.5 mL/hr over 240 Minutes Intravenous Every 8 hours 01/08/14 1605     01/08/14 2200  metroNIDAZOLE (FLAGYL) tablet 500 mg     500 mg Oral 3 times per day 01/08/14 2127 01/22/14 2159   01/08/14 1615  piperacillin-tazobactam (ZOSYN) IVPB 3.375 g     3.375 g 100 mL/hr over 30 Minutes Intravenous  Once 01/08/14 1605 01/08/14 1700      Assessment/Plan:  Rim-enhancing pelvic abscess. Scheduled for percutaneous drainage in IR today. I discussed this with Dr. Salome HolmesHector Cooper in radiology who will be sure that this gets on the schedule IV zosyn  Status post laparoscopic appendectomy for ruptured appendicitis 12/18/2013  Positive C. Difficile PCR. P.o. flagyl   LOS: 1 day    Dylan Parsons,Dylan Parsons 01/09/2014

## 2014-01-09 NOTE — Procedures (Signed)
Interventional Radiology Procedure Note  Procedure: Placement of 37F perc drain via right transglut approach.  40 mL purulent fluid aspirated.  Cavity lavaged with saline and drain to JP bulb.  Complications: None immediate Recommendations: - Drain to JP - Follow Cx - Repeat CT pelvis with contrast prior to drain removal  Signed,  Sterling BigHeath K. Nadyne Gariepy, MD [facility]

## 2014-01-09 NOTE — H&P (Signed)
Chief Complaint: "chills, fatigue and abdominal pain." Referring Physician: Dr. Dalbert Batman HPI: Dylan Parsons is an 28 y.o. male who underwent a laparoscopic appendectomy on 5/30, he states since surgery he has had difficulty emptying his bladder completely and experiencing RLQ abdominal pain with generalized fatigue and chills. He is also c/o loose stools. He presented to [facility] ED 6/20 and imaging reveals a large rim enhancing fluid collection in the central pelvis and some fullness of the right ureter related to extrinsic compression upon the distal third of the right ureter by the pelvic abscess. IR received request for image guided percutaneous drain placement. The patient denies any active fever or chills. He admits to 1/10 RLQ pain at rest and 3/10 pain with movement. He denies any chest pain, shortness of breath or palpitations. He denies any active signs of bleeding or excessive bruising. The patient denies any history of sleep apnea or chronic oxygen use. He has previously tolerated sedation without complications, but does wake up during procedures and requires a lot of medication.   Past Medical History:  Past Medical History  Diagnosis Date  . Complication of anesthesia     "anesthesia wears off rapidly"  . Family history of anesthesia complication     "anesthesia wears off rapidly for my mom"  . Depression     Past Surgical History:  Past Surgical History  Procedure Laterality Date  . Laparoscopic appendectomy N/A 12/18/2013    Procedure: APPENDECTOMY LAPAROSCOPIC;  Surgeon: Gwenyth Ober, MD;  Location: [place];  Service: General;  Laterality: N/A;  . Appendectomy    . Wisdom tooth extraction  2000's    "all 4"    Family History: History reviewed. No pertinent family history.  Social History:  reports that he has never smoked. He has never used smokeless tobacco. He reports that he drinks about 4.2 ounces of alcohol per week. He reports that he does not use illicit drugs.  Allergies:  No Known Allergies  Medications:   Medication List    ASK your doctor about these medications       ibuprofen 200 MG tablet  Commonly known as:  ADVIL,MOTRIN  Take 600 mg by mouth every 6 (six) hours as needed for moderate pain.     valACYclovir 500 MG tablet  Commonly known as:  VALTREX  Take 500 mg by mouth 2 (two) times a week. Monday and Friday       Please HPI for pertinent positives, otherwise complete 10 system ROS negative.  Physical Exam: BP 122/64  Pulse 72  Temp(Src) 98 F (36.7 C) (Oral)  Resp 18  Ht 6' (1.829 m)  Wt 164 lb 3 oz (74.475 kg)  BMI 22.26 kg/m2  SpO2 100% Body mass index is 22.26 kg/(m^2).  General Appearance:  Alert, cooperative, no distress  Head:  Normocephalic, without obvious abnormality, atraumatic  Neck: Supple, symmetrical, trachea midline  Lungs:   Clear to auscultation bilaterally, no w/r/r, respirations unlabored without use of accessory muscles.  Chest Wall:  No tenderness or deformity  Heart:  Regular rate and rhythm, S1, S2 normal, no murmur, rub or gallop.  Abdomen:   Soft, RLQ tenderness to palpation, non distended, (+) BS  Extremities: Extremities normal, atraumatic, no cyanosis or edema  Neurologic: Normal affect, no gross deficits.   Results for orders placed during the hospital encounter of 01/08/14 (from the past 48 hour(s))  CBC WITH DIFFERENTIAL     Status: None   Collection Time    01/08/14 12:25  PM      Result Value Ref Range   WBC 8.1  4.0 - 10.5 K/uL   RBC 4.73  4.22 - 5.81 MIL/uL   Hemoglobin 14.4  13.0 - 17.0 g/dL   HCT 41.2  39.0 - 52.0 %   MCV 87.1  78.0 - 100.0 fL   MCH 30.4  26.0 - 34.0 pg   MCHC 35.0  30.0 - 36.0 g/dL   RDW 12.2  11.5 - 15.5 %   Platelets 325  150 - 400 K/uL   Neutrophils Relative % 77  43 - 77 %   Neutro Abs 6.2  1.7 - 7.7 K/uL   Lymphocytes Relative 14  12 - 46 %   Lymphs Abs 1.1  0.7 - 4.0 K/uL   Monocytes Relative 8  3 - 12 %   Monocytes Absolute 0.7  0.1 - 1.0 K/uL    Eosinophils Relative 1  0 - 5 %   Eosinophils Absolute 0.0  0.0 - 0.7 K/uL   Basophils Relative 0  0 - 1 %   Basophils Absolute 0.0  0.0 - 0.1 K/uL  COMPREHENSIVE METABOLIC PANEL     Status: Abnormal   Collection Time    01/08/14 12:25 PM      Result Value Ref Range   Sodium 140  137 - 147 mEq/L   Potassium 4.4  3.7 - 5.3 mEq/L   Chloride 101  96 - 112 mEq/L   CO2 27  19 - 32 mEq/L   Glucose, Bld 129 (*) 70 - 99 mg/dL   BUN 7  6 - 23 mg/dL   Creatinine, Ser 0.84  0.50 - 1.35 mg/dL   Calcium 9.6  8.4 - 10.5 mg/dL   Total Protein 7.5  6.0 - 8.3 g/dL   Albumin 3.7  3.5 - 5.2 g/dL   AST 25  0 - 37 U/L   ALT 15  0 - 53 U/L   Alkaline Phosphatase 87  39 - 117 U/L   Total Bilirubin 0.8  0.3 - 1.2 mg/dL   GFR calc non Af Amer >90  >90 mL/min   GFR calc Af Amer >90  >90 mL/min   Comment: (NOTE)     The eGFR has been calculated using the CKD EPI equation.     This calculation has not been validated in all clinical situations.     eGFR's persistently <90 mL/min signify possible Chronic Kidney     Disease.  LIPASE, BLOOD     Status: Abnormal   Collection Time    01/08/14 12:25 PM      Result Value Ref Range   Lipase 79 (*) 11 - 59 U/L  URINALYSIS, ROUTINE W REFLEX MICROSCOPIC     Status: None   Collection Time    01/08/14 12:49 PM      Result Value Ref Range   Color, Urine YELLOW  YELLOW   APPearance CLEAR  CLEAR   Specific Gravity, Urine 1.017  1.005 - 1.030   pH 5.5  5.0 - 8.0   Glucose, UA NEGATIVE  NEGATIVE mg/dL   Hgb urine dipstick NEGATIVE  NEGATIVE   Bilirubin Urine NEGATIVE  NEGATIVE   Ketones, ur NEGATIVE  NEGATIVE mg/dL   Protein, ur NEGATIVE  NEGATIVE mg/dL   Urobilinogen, UA 0.2  0.0 - 1.0 mg/dL   Nitrite NEGATIVE  NEGATIVE   Leukocytes, UA NEGATIVE  NEGATIVE   Comment: MICROSCOPIC NOT DONE ON URINES WITH NEGATIVE PROTEIN, BLOOD, LEUKOCYTES, NITRITE, OR GLUCOSE <  1000 mg/dL.  CLOSTRIDIUM DIFFICILE BY PCR     Status: Abnormal   Collection Time    01/08/14  6:03 PM       Result Value Ref Range   C difficile by pcr POSITIVE (*) NEGATIVE   Comment: CRITICAL RESULT CALLED TO, READ BACK BY AND VERIFIED WITH:     J.Frisco 01/08/14 M.CAMPBELL  COMPREHENSIVE METABOLIC PANEL     Status: Abnormal   Collection Time    01/09/14  3:05 AM      Result Value Ref Range   Sodium 139  137 - 147 mEq/L   Potassium 4.3  3.7 - 5.3 mEq/L   Chloride 101  96 - 112 mEq/L   CO2 27  19 - 32 mEq/L   Glucose, Bld 94  70 - 99 mg/dL   BUN 5 (*) 6 - 23 mg/dL   Creatinine, Ser 1.08  0.50 - 1.35 mg/dL   Calcium 9.2  8.4 - 10.5 mg/dL   Total Protein 6.6  6.0 - 8.3 g/dL   Albumin 3.2 (*) 3.5 - 5.2 g/dL   AST 13  0 - 37 U/L   ALT 9  0 - 53 U/L   Alkaline Phosphatase 72  39 - 117 U/L   Total Bilirubin 0.8  0.3 - 1.2 mg/dL   GFR calc non Af Amer >90  >90 mL/min   GFR calc Af Amer >90  >90 mL/min   Comment: (NOTE)     The eGFR has been calculated using the CKD EPI equation.     This calculation has not been validated in all clinical situations.     eGFR's persistently <90 mL/min signify possible Chronic Kidney     Disease.  MAGNESIUM     Status: None   Collection Time    01/09/14  3:05 AM      Result Value Ref Range   Magnesium 1.9  1.5 - 2.5 mg/dL  PHOSPHORUS     Status: None   Collection Time    01/09/14  3:05 AM      Result Value Ref Range   Phosphorus 4.2  2.3 - 4.6 mg/dL  CBC     Status: Abnormal   Collection Time    01/09/14  3:05 AM      Result Value Ref Range   WBC 6.6  4.0 - 10.5 K/uL   RBC 4.37  4.22 - 5.81 MIL/uL   Hemoglobin 13.3  13.0 - 17.0 g/dL   HCT 38.0 (*) 39.0 - 52.0 %   MCV 87.0  78.0 - 100.0 fL   MCH 30.4  26.0 - 34.0 pg   MCHC 35.0  30.0 - 36.0 g/dL   RDW 12.3  11.5 - 15.5 %   Platelets 258  150 - 400 K/uL   Ct Abdomen Pelvis W Contrast  01/08/2014   CLINICAL DATA:  Severe abdominal pain radiating to the back since last night. Fever. Loose stools. History of appendectomy 3 weeks ago.  EXAM: CT ABDOMEN AND PELVIS WITH CONTRAST   TECHNIQUE: Multidetector CT imaging of the abdomen and pelvis was performed using the standard protocol following bolus administration of intravenous contrast.  CONTRAST:  153m OMNIPAQUE IOHEXOL 300 MG/ML SOLN, 523mOMNIPAQUE IOHEXOL 300 MG/ML SOLN  COMPARISON:  CT of the abdomen and pelvis 12/18/2013.  FINDINGS: Lung Bases: Unremarkable.  Abdomen/Pelvis: In the central aspect of the anatomic pelvis immediately anterior to the proximal rectum and inferior to the distal sigmoid colon there is an irregular-shaped rim  enhancing collection of fluid compatible with an abscess. The largest bulk of this collection is demonstrated on image 77 of series 2 and image 74 of series 5 where it measures approximately 4.1 x 5.8 x 3.9 cm, however, there is a portion of this collection which extends anteriorly best appreciated on images 75 and 76 of series 2. The adjacent colonic and rectal walls appear thickened, presumably reactive to the adjacent infected fluid collection. No other definite collections are identified in the abdomen or pelvis. No significant volume of ascites. No pneumoperitoneum. No pathologic distention of small bowel or colon.  The appearance of the liver, gallbladder, pancreas, spleen, bilateral adrenal glands and bilateral kidneys is unremarkable. There is some very mild fullness of the right ureter, presumably related to some mild extrinsic compression upon the distal third of the right ureter which comes in close proximity to a portion of the rim enhancing fluid collection in the pelvis. Prostate gland and urinary bladder are unremarkable in appearance.  Musculoskeletal: There are no aggressive appearing lytic or blastic lesions noted in the visualized portions of the skeleton.  IMPRESSION: 1. Large rim enhancing fluid collection in the central pelvis compatible with an abscess, as detailed above. 2. The adjacent loops of distal sigmoid colon and proximal rectum are markedly thickened, presumably reactive  to the adjacent infected fluid collection. 3. In addition, there is some fullness of the right ureter (without frank hydroureteronephrosis), related to extrinsic compression upon the distal third of the right ureter by the pelvic abscess. These results were called by telephone at the time of interpretation on 01/08/2014 at 3:42 PM to [place], who verbally acknowledged these results.   Electronically Signed   By: Vinnie Langton M.D.   On: 01/08/2014 15:45    Assessment/Plan S/p laparoscopic appendectomy 5/30 Pelvic abscess on IV zosyn C. Difficile  Request for image guided percutaneous pelvic drain placement with moderate sedation Patient has been NPO, no blood thinners, labs and images reviewed. Risks and Benefits discussed with the patient. All of the patient's questions were answered, patient is agreeable to proceed. Consent signed and in chart.   Tsosie Billing D PA-C 01/09/2014, 10:09 AM

## 2014-01-09 NOTE — ED Provider Notes (Signed)
Medical screening examination/treatment/procedure(s) were performed by non-physician practitioner and as supervising physician I was immediately available for consultation/collaboration.   EKG Interpretation None        Gwyneth SproutWhitney Plunkett, MD 01/09/14 1105

## 2014-01-10 MED ORDER — SODIUM CHLORIDE 0.9 % IJ SOLN: 3.0000 mL | INTRAMUSCULAR | Status: DC | PRN

## 2014-01-10 MED ORDER — HYDROMORPHONE HCL 2 MG PO TABS
2.0000 mg | ORAL_TABLET | Freq: Four times a day (QID) | ORAL | Status: DC | PRN
  Administered 2014-01-10: 2 mg via ORAL
  Filled 2014-01-10 (×2): qty 1

## 2014-01-10 MED ORDER — SODIUM CHLORIDE 0.9 % IJ SOLN: 3.0000 mL | Freq: Two times a day (BID) | INTRAMUSCULAR | Status: DC

## 2014-01-10 MED ORDER — ENOXAPARIN SODIUM 40 MG/0.4ML ~~LOC~~ SOLN
40.0000 mg | SUBCUTANEOUS | Status: DC
  Administered 2014-01-10: 40 mg via SUBCUTANEOUS
  Filled 2014-01-10 (×2): qty 0.4

## 2014-01-10 MED ORDER — NAPROXEN 250 MG PO TABS
250.0000 mg | ORAL_TABLET | Freq: Two times a day (BID) | ORAL | Status: DC
  Administered 2014-01-10 – 2014-01-11 (×2): 250 mg via ORAL
  Filled 2014-01-10 (×4): qty 1

## 2014-01-10 NOTE — Progress Notes (Signed)
Patient ID: Dylan Parsons, male   DOB: January 20, 1986, 28 y.o.   MRN: [id number]  Subjective: pain well controlled, WBC normal, tolerating a diet, formed stools Afebrile VSS.  Objective:  Vital signs:  Filed Vitals:   01/09/14 1330 01/09/14 1410 01/09/14 2151 01/10/14 0555  BP: 106/74 113/62 132/79 112/67  Pulse: 78 66 78 67  Temp:  97.9 F (36.6 C) 98.4 F (36.9 C) 98.2 F (36.8 C)  TempSrc:  Oral Oral   Resp: 20 16 17 18   Height:      Weight:      SpO2: 97% 97% 100% 96%    Last BM Date: 01/08/14  Intake/Output   Yesterday:  06/21 0701 - 06/22 0700 In: 2493.8 [I.V.:2493.8] Out: 140 [Drains:140] This shift:    I/O last 3 completed shifts: In: 2493.8 [I.V.:2493.8] Out: 140 [Drains:140]   Physical Exam: General: Pt awake/alert/oriented x4 in no acute distress Chest: cta.  No chest wall pain w good excursion CV:  Pulses intact.  Regular rhythm MS: Normal AROM mjr joints.  No obvious deformity Abdomen: Soft.  Nondistended. Mild ttp rlq. No evidence of peritonitis.  No incarcerated hernias. JP drain with serosanguinous output Ext:  SCDs BLE.  No mjr edema.  No cyanosis Skin: No petechiae / purpura  Drain---165m/24h  Problem List:   Active Problems:   Intra-abdominal abscess   C. difficile diarrhea    Results:   Labs: Results for orders placed during the hospital encounter of 01/08/14 (from the past 48 hour(s))  CBC WITH DIFFERENTIAL     Status: None   Collection Time    01/08/14 12:25 PM      Result Value Ref Range   WBC 8.1  4.0 - 10.5 K/uL   RBC 4.73  4.22 - 5.81 MIL/uL   Hemoglobin 14.4  13.0 - 17.0 g/dL   HCT 41.2  39.0 - 52.0 %   MCV 87.1  78.0 - 100.0 fL   MCH 30.4  26.0 - 34.0 pg   MCHC 35.0  30.0 - 36.0 g/dL   RDW 12.2  11.5 - 15.5 %   Platelets 325  150 - 400 K/uL   Neutrophils Relative % 77  43 - 77 %   Neutro Abs 6.2  1.7 - 7.7 K/uL   Lymphocytes Relative 14  12 - 46 %   Lymphs Abs 1.1  0.7 - 4.0 K/uL   Monocytes Relative 8  3 - 12 %    Monocytes Absolute 0.7  0.1 - 1.0 K/uL   Eosinophils Relative 1  0 - 5 %   Eosinophils Absolute 0.0  0.0 - 0.7 K/uL   Basophils Relative 0  0 - 1 %   Basophils Absolute 0.0  0.0 - 0.1 K/uL  COMPREHENSIVE METABOLIC PANEL     Status: Abnormal   Collection Time    01/08/14 12:25 PM      Result Value Ref Range   Sodium 140  137 - 147 mEq/L   Potassium 4.4  3.7 - 5.3 mEq/L   Chloride 101  96 - 112 mEq/L   CO2 27  19 - 32 mEq/L   Glucose, Bld 129 (*) 70 - 99 mg/dL   BUN 7  6 - 23 mg/dL   Creatinine, Ser 0.84  0.50 - 1.35 mg/dL   Calcium 9.6  8.4 - 10.5 mg/dL   Total Protein 7.5  6.0 - 8.3 g/dL   Albumin 3.7  3.5 - 5.2 g/dL   AST 25  0 -  37 U/L   ALT 15  0 - 53 U/L   Alkaline Phosphatase 87  39 - 117 U/L   Total Bilirubin 0.8  0.3 - 1.2 mg/dL   GFR calc non Af Amer >90  >90 mL/min   GFR calc Af Amer >90  >90 mL/min   Comment: (NOTE)     The eGFR has been calculated using the CKD EPI equation.     This calculation has not been validated in all clinical situations.     eGFR's persistently <90 mL/min signify possible Chronic Kidney     Disease.  LIPASE, BLOOD     Status: Abnormal   Collection Time    01/08/14 12:25 PM      Result Value Ref Range   Lipase 79 (*) 11 - 59 U/L  URINALYSIS, ROUTINE W REFLEX MICROSCOPIC     Status: None   Collection Time    01/08/14 12:49 PM      Result Value Ref Range   Color, Urine YELLOW  YELLOW   APPearance CLEAR  CLEAR   Specific Gravity, Urine 1.017  1.005 - 1.030   pH 5.5  5.0 - 8.0   Glucose, UA NEGATIVE  NEGATIVE mg/dL   Hgb urine dipstick NEGATIVE  NEGATIVE   Bilirubin Urine NEGATIVE  NEGATIVE   Ketones, ur NEGATIVE  NEGATIVE mg/dL   Protein, ur NEGATIVE  NEGATIVE mg/dL   Urobilinogen, UA 0.2  0.0 - 1.0 mg/dL   Nitrite NEGATIVE  NEGATIVE   Leukocytes, UA NEGATIVE  NEGATIVE   Comment: MICROSCOPIC NOT DONE ON URINES WITH NEGATIVE PROTEIN, BLOOD, LEUKOCYTES, NITRITE, OR GLUCOSE <1000 mg/dL.  CLOSTRIDIUM DIFFICILE BY PCR     Status: Abnormal    Collection Time    01/08/14  6:03 PM      Result Value Ref Range   C difficile by pcr POSITIVE (*) NEGATIVE   Comment: CRITICAL RESULT CALLED TO, READ BACK BY AND VERIFIED WITH:     J.[place] 01/08/14 M.CAMPBELL  COMPREHENSIVE METABOLIC PANEL     Status: Abnormal   Collection Time    01/09/14  3:05 AM      Result Value Ref Range   Sodium 139  137 - 147 mEq/L   Potassium 4.3  3.7 - 5.3 mEq/L   Chloride 101  96 - 112 mEq/L   CO2 27  19 - 32 mEq/L   Glucose, Bld 94  70 - 99 mg/dL   BUN 5 (*) 6 - 23 mg/dL   Creatinine, Ser 1.08  0.50 - 1.35 mg/dL   Calcium 9.2  8.4 - 10.5 mg/dL   Total Protein 6.6  6.0 - 8.3 g/dL   Albumin 3.2 (*) 3.5 - 5.2 g/dL   AST 13  0 - 37 U/L   ALT 9  0 - 53 U/L   Alkaline Phosphatase 72  39 - 117 U/L   Total Bilirubin 0.8  0.3 - 1.2 mg/dL   GFR calc non Af Amer >90  >90 mL/min   GFR calc Af Amer >90  >90 mL/min   Comment: (NOTE)     The eGFR has been calculated using the CKD EPI equation.     This calculation has not been validated in all clinical situations.     eGFR's persistently <90 mL/min signify possible Chronic Kidney     Disease.  MAGNESIUM     Status: None   Collection Time    01/09/14  3:05 AM      Result Value Ref  Range   Magnesium 1.9  1.5 - 2.5 mg/dL  PHOSPHORUS     Status: None   Collection Time    01/09/14  3:05 AM      Result Value Ref Range   Phosphorus 4.2  2.3 - 4.6 mg/dL  CBC     Status: Abnormal   Collection Time    01/09/14  3:05 AM      Result Value Ref Range   WBC 6.6  4.0 - 10.5 K/uL   RBC 4.37  4.22 - 5.81 MIL/uL   Hemoglobin 13.3  13.0 - 17.0 g/dL   HCT 38.0 (*) 39.0 - 52.0 %   MCV 87.0  78.0 - 100.0 fL   MCH 30.4  26.0 - 34.0 pg   MCHC 35.0  30.0 - 36.0 g/dL   RDW 12.3  11.5 - 15.5 %   Platelets 258  150 - 400 K/uL  CULTURE, ROUTINE-ABSCESS     Status: None   Collection Time    01/09/14  2:09 PM      Result Value Ref Range   Specimen Description ABSCESS PERITONEAL CAVITY     Special Requests NONE      Gram Stain PENDING     Culture       Value: Culture reincubated for better growth     Performed at [facility]   Report Status PENDING      Imaging / Studies: Ct Abdomen Pelvis W Contrast  01/08/2014   CLINICAL DATA:  Severe abdominal pain radiating to the back since last night. Fever. Loose stools. History of appendectomy 3 weeks ago.  EXAM: CT ABDOMEN AND PELVIS WITH CONTRAST  TECHNIQUE: Multidetector CT imaging of the abdomen and pelvis was performed using the standard protocol following bolus administration of intravenous contrast.  CONTRAST:  167m OMNIPAQUE IOHEXOL 300 MG/ML SOLN, 577mOMNIPAQUE IOHEXOL 300 MG/ML SOLN  COMPARISON:  CT of the abdomen and pelvis 12/18/2013.  FINDINGS: Lung Bases: Unremarkable.  Abdomen/Pelvis: In the central aspect of the anatomic pelvis immediately anterior to the proximal rectum and inferior to the distal sigmoid colon there is an irregular-shaped rim enhancing collection of fluid compatible with an abscess. The largest bulk of this collection is demonstrated on image 77 of series 2 and image 74 of series 5 where it measures approximately 4.1 x 5.8 x 3.9 cm, however, there is a portion of this collection which extends anteriorly best appreciated on images 75 and 76 of series 2. The adjacent colonic and rectal walls appear thickened, presumably reactive to the adjacent infected fluid collection. No other definite collections are identified in the abdomen or pelvis. No significant volume of ascites. No pneumoperitoneum. No pathologic distention of small bowel or colon.  The appearance of the liver, gallbladder, pancreas, spleen, bilateral adrenal glands and bilateral kidneys is unremarkable. There is some very mild fullness of the right ureter, presumably related to some mild extrinsic compression upon the distal third of the right ureter which comes in close proximity to a portion of the rim enhancing fluid collection in the pelvis. Prostate gland and  urinary bladder are unremarkable in appearance.  Musculoskeletal: There are no aggressive appearing lytic or blastic lesions noted in the visualized portions of the skeleton.  IMPRESSION: 1. Large rim enhancing fluid collection in the central pelvis compatible with an abscess, as detailed above. 2. The adjacent loops of distal sigmoid colon and proximal rectum are markedly thickened, presumably reactive to the adjacent infected fluid collection. 3. In addition, there is  some fullness of the right ureter (without frank hydroureteronephrosis), related to extrinsic compression upon the distal third of the right ureter by the pelvic abscess. These results were called by telephone at the time of interpretation on 01/08/2014 at 3:42 PM to [place], who verbally acknowledged these results.   Electronically Signed   By: Vinnie Langton M.D.   On: 01/08/2014 15:45   Scheduled Meds: . metroNIDAZOLE  500 mg Oral 3 times per day  . pantoprazole (PROTONIX) IV  40 mg Intravenous QHS  . piperacillin-tazobactam (ZOSYN)  IV  3.375 g Intravenous Q8H  . sodium chloride  3 mL Intravenous Q12H   Continuous Infusions:  PRN Meds:.diphenhydrAMINE, diphenhydrAMINE, HYDROcodone-acetaminophen, HYDROmorphone (DILAUDID) injection, morphine injection, ondansetron, sodium chloride   Antibiotics: Anti-infectives   Start     Dose/Rate Route Frequency Ordered Stop   01/08/14 2200  piperacillin-tazobactam (ZOSYN) IVPB 3.375 g     3.375 g 12.5 mL/hr over 240 Minutes Intravenous Every 8 hours 01/08/14 1605     01/08/14 2200  metroNIDAZOLE (FLAGYL) tablet 500 mg     500 mg Oral 3 times per day 01/08/14 2127 01/22/14 2159   01/08/14 1615  piperacillin-tazobactam (ZOSYN) IVPB 3.375 g     3.375 g 100 mL/hr over 30 Minutes Intravenous  Once 01/08/14 1605 01/08/14 1700      Assessment/Plan:  Rim-enhancing pelvic abscess s/p perc drain placement 01/09/14 -IV zosyn D#2, ? When to change to PO and discharge -drain  care -follow cultures -SCD/lovenox Status post laparoscopic appendectomy for ruptured appendicitis 12/18/2013  Positive C. Difficile PCR.  P.o. flagyl  Erby Pian, [facility] Pager [phone] Office [phone]  01/10/2014 8:29 AM

## 2014-01-10 NOTE — Progress Notes (Signed)
Subjective: Pt is POD #1 from right transgluteal pelvic abscess drain. C/o pain at drain site, but overall feeling much better  Objective: Physical Exam: BP 112/67  Pulse 67  Temp(Src) 98.2 F (36.8 C) (Oral)  Resp 18  Ht 6' (1.829 m)  Wt 164 lb 3 oz (74.475 kg)  BMI 22.26 kg/m2  SpO2 96% (R)TG drain intact, serosanguinous output    Labs: CBC  Recent Labs  01/08/14 1225 01/09/14 0305  WBC 8.1 6.6  HGB 14.4 13.3  HCT 41.2 38.0*  PLT 325 258   BMET  Recent Labs  01/08/14 1225 01/09/14 0305  NA 140 139  K 4.4 4.3  CL 101 101  CO2 27 27  GLUCOSE 129* 94  BUN 7 5*  CREATININE 0.84 1.08  CALCIUM 9.6 9.2   LFT  Recent Labs  01/08/14 1225 01/09/14 0305  PROT 7.5 6.6  ALBUMIN 3.7 3.2*  AST 25 13  ALT 15 9  ALKPHOS 87 72  BILITOT 0.8 0.8  LIPASE 79*  --    PT/INR No results found for this basename: LABPROT, INR,  in the last 72 hours   Studies/Results: Ct Abdomen Pelvis W Contrast  01/08/2014   CLINICAL DATA:  Severe abdominal pain radiating to the back since last night. Fever. Loose stools. History of appendectomy 3 weeks ago.  EXAM: CT ABDOMEN AND PELVIS WITH CONTRAST  TECHNIQUE: Multidetector CT imaging of the abdomen and pelvis was performed using the standard protocol following bolus administration of intravenous contrast.  CONTRAST:  100mL OMNIPAQUE IOHEXOL 300 MG/ML SOLN, 50mL OMNIPAQUE IOHEXOL 300 MG/ML SOLN  COMPARISON:  CT of the abdomen and pelvis 12/18/2013.  FINDINGS: Lung Bases: Unremarkable.  Abdomen/Pelvis: In the central aspect of the anatomic pelvis immediately anterior to the proximal rectum and inferior to the distal sigmoid colon there is an irregular-shaped rim enhancing collection of fluid compatible with an abscess. The largest bulk of this collection is demonstrated on image 77 of series 2 and image 74 of series 5 where it measures approximately 4.1 x 5.8 x 3.9 cm, however, there is a portion of this collection which extends anteriorly  best appreciated on images 75 and 76 of series 2. The adjacent colonic and rectal walls appear thickened, presumably reactive to the adjacent infected fluid collection. No other definite collections are identified in the abdomen or pelvis. No significant volume of ascites. No pneumoperitoneum. No pathologic distention of small bowel or colon.  The appearance of the liver, gallbladder, pancreas, spleen, bilateral adrenal glands and bilateral kidneys is unremarkable. There is some very mild fullness of the right ureter, presumably related to some mild extrinsic compression upon the distal third of the right ureter which comes in close proximity to a portion of the rim enhancing fluid collection in the pelvis. Prostate gland and urinary bladder are unremarkable in appearance.  Musculoskeletal: There are no aggressive appearing lytic or blastic lesions noted in the visualized portions of the skeleton.  IMPRESSION: 1. Large rim enhancing fluid collection in the central pelvis compatible with an abscess, as detailed above. 2. The adjacent loops of distal sigmoid colon and proximal rectum are markedly thickened, presumably reactive to the adjacent infected fluid collection. 3. In addition, there is some fullness of the right ureter (without frank hydroureteronephrosis), related to extrinsic compression upon the distal third of the right ureter by the pelvic abscess. These results were called by telephone at the time of interpretation on 01/08/2014 at 3:42 PM to PA [facility], who verbally acknowledged  these results.   Electronically Signed   By: Trudie Reedaniel  Entrikin M.D.   On: 01/08/2014 15:45   Ct Image Guided Fluid Drain By Catheter  01/10/2014   CLINICAL DATA:  28 year old male with history of acute appendicitis status post appendectomy on 12/18/2013. His postoperative course was complicated by formation of a peripherally enhancing fluid collection in the deep anatomic pelvis concerning for abscess. Percutaneous  drainage is warranted.  EXAM: CT IMAGE GUIDED FLUID DRAIN BY CATHETER  Date: 01/10/2014  PROCEDURE: 1. CT-guided placement of a right trans gluteal percutaneous drain Interventional Radiologist:  Sterling BigHeath K. McCullough, MD  ANESTHESIA/SEDATION: Moderate (conscious) sedation was used. Six mg Versed, 200 mcg Fentanyl were administered intravenously. The patient's vital signs were monitored continuously by radiology nursing throughout the procedure.  Sedation Time: 30 minutes  TECHNIQUE: Informed consent was obtained from the patient following explanation of the procedure, risks, benefits and alternatives. The patient understands, agrees and consents for the procedure. All questions were addressed. A time out was performed.  A planning axial CT scan was performed. The fluid collection in the recto vesicular recess was successfully identified. A suitable skin entry site was selected and marked the will allow passage through the sacrospinous ligament medial to the sciatic nerve.  The right gluteal region was sterilely prepped and draped in the usual fashion using Betadine skin prep. Local anesthesia was attained with infiltration of 1% lidocaine. Under intermittent CT fluoroscopic guidance, an 18 gauge trocar needle was advanced into the fluid collection. There was return of purulent material through the needle hub. A 0.035 Amplatz wire was coiled within the abscess collection. The skin tract was then serially dilated to 12 [place] and a Cook 12 [place] all-purpose drainage catheter was advanced into the abscess collection. The locking loop was formed in approximately 45 mL of frankly purulent fluid was abscess. The abscess cavity was then lavaged serially with sterile saline and connected to JP bulb suction. The tube was secured to the skin with 0 Prolene suture and an adhesive fixation device. The patient tolerated the procedure very well. The aspirated fluid was sent for culture.  COMPLICATIONS: None immediate.  IMPRESSION:  Technically successful placement of a right trans gluteal approach 12 French drainage catheter into the recto vesicular abscess. Approximately 45 mL of frankly purulent material was aspirated. A sample was sent for culture and sensitivities.  Signed,  Sterling BigHeath K. McCullough, MD  [facility]  [facility]   Electronically Signed   By: Malachy MoanHeath  McCullough M.D.   On: 01/10/2014 09:00    Assessment/Plan: S/p (R)TG pelvic abscess drain Cont flushes as ordered. Other plans per [facility]    LOS: 2 days    Brayton ElBRUNING, KEVIN PA-C 01/10/2014 10:38 AM

## 2014-01-10 NOTE — Progress Notes (Signed)
Agree with above, await cultures, will  Try to get pain control better and then possibly send home tomorrow on oral abx and flagyl

## 2014-01-11 ENCOUNTER — Other Ambulatory Visit (INDEPENDENT_AMBULATORY_CARE_PROVIDER_SITE_OTHER): Payer: Self-pay

## 2014-01-11 DIAGNOSIS — K651 Peritoneal abscess: Principal | ICD-10-CM

## 2014-01-11 DIAGNOSIS — A0472 Enterocolitis due to Clostridium difficile, not specified as recurrent: Secondary | ICD-10-CM

## 2014-01-11 DIAGNOSIS — T814XXD Infection following a procedure, subsequent encounter: Principal | ICD-10-CM

## 2014-01-11 DIAGNOSIS — IMO0001 Reserved for inherently not codable concepts without codable children: Secondary | ICD-10-CM

## 2014-01-11 LAB — CBC
HCT: 38.7 % — ABNORMAL LOW (ref 39.0–52.0)
HEMOGLOBIN: 13.5 g/dL (ref 13.0–17.0)
MCH: 30.3 pg (ref 26.0–34.0)
MCHC: 34.9 g/dL (ref 30.0–36.0)
MCV: 86.8 fL (ref 78.0–100.0)
PLATELETS: 203 10*3/uL (ref 150–400)
RBC: 4.46 MIL/uL (ref 4.22–5.81)
RDW: 12.2 % (ref 11.5–15.5)
WBC: 4 10*3/uL (ref 4.0–10.5)

## 2014-01-11 MED ORDER — CIPROFLOXACIN HCL 500 MG PO TABS: 500.0000 mg | ORAL_TABLET | Freq: Two times a day (BID) | ORAL | Status: DC

## 2014-01-11 MED ORDER — METRONIDAZOLE 500 MG PO TABS: 500.0000 mg | ORAL_TABLET | Freq: Three times a day (TID) | ORAL | Status: DC

## 2014-01-11 MED ORDER — OXYCODONE-ACETAMINOPHEN 5-325 MG PO TABS: 1.0000 | ORAL_TABLET | ORAL | Status: DC | PRN

## 2014-01-11 NOTE — Discharge Instructions (Signed)
Flush drain with 5cc NS twice a day at home  Bulb [facility] A bulb drain consists of a thin rubber tube and a soft, round bulb that creates a gentle suction. The rubber tube is placed in the area where you had surgery. A bulb is attached to the end of the tube that is outside the body. The bulb drain removes excess fluid that normally builds up in a surgical wound after surgery. The color and amount of fluid will vary. Immediately after surgery, the fluid is bright red and is a little thicker than water. It may gradually change to a yellow or pink color and become more thin and water-like. When the amount decreases to about 1 or 2 tbsp in 24 hours, your health care provider will usually remove it. DAILY CARE  Keep the bulb flat (compressed) at all times, except while emptying it. The flatness creates suction. You can flatten the bulb by squeezing it firmly in the middle and then closing the cap.  Keep sites where the tube enters the skin dry and covered with a bandage (dressing).  Secure the tube 1-2 in (2.5-5.1 cm) below the insertion sites, to keep it from pulling on your stitches. The tube is stitched in place and will not slip out.  Secure the bulb as directed by your health care provider.  For the first 3 days after surgery, there usually is more fluid in the bulb. Empty the bulb whenever it becomes half full because the bulb does not create enough suction if it is too full. The bulb could also overflow. Write down how much fluid you remove each time you empty your drain. Add up the amount removed in 24 hours.  Empty the bulb at the same time every day once the amount of fluid decreases and you only need to empty it once a day. Write down the amounts and the 24-hour totals to give to your health care provider. This helps your health care provider know when the tubes can be removed. EMPTYING THE BULB DRAIN Before emptying the bulb, get a measuring cup, a piece of paper, and a pen and wash  your hands.  Gently run your fingers down the tube (stripping) to empty any drainage from the tubing into the bulb. This may need to be done several times a day to clear the tubing of clots and tissue.  Open the bulb cap to release suction, which causes it to inflate. Do not touch the inside of the cap.  Gently run your fingers down the tube (stripping) to empty any drainage from the tubing into the bulb.  Hold the cap out of the way, and pour fluid into the measuring cup.   Squeeze the bulb to provide suction.  Replace the cap.   Check the tape that holds the tube to your skin. If it is becoming loose, you can remove the loose piece of tape and apply a new one. Then, pin the bulb to your shirt.   Write down the amount of fluid you emptied out. Write down the date and each time you emptied your bulb drain. (If there are 2 bulbs, note the amount of drainage from each bulb and keep the totals separate. Your health care provider will want to know the total amounts for each drain and which tube is draining more.)   Flush the fluid down the toilet and wash your hands.   Call your health care provider once you have less than 2 tbsp  of fluid collecting in the bulb drain every 24 hours. If there is drainage around the tube site, change dressings and keep the area dry. Cleanse around tube with sterile saline and place dry gauze around site. This gauze should be changed when it is soiled. If it stays clean and unsoiled, it should still be changed daily.  SEEK MEDICAL CARE IF:  Your drainage has a bad smell or is cloudy.   You have a fever.   Your drainage is increasing instead of decreasing.   Your tube fell out.   You have redness or swelling around the tube site.   You have drainage from a surgical wound.   Your bulb drain will not stay flat after you empty it.  MAKE SURE YOU:   Understand these instructions.  Will watch your condition.  Will get help right away if you  are not doing well or get worse. Document Released: 07/05/2000 Document Revised: 04/28/2013 Document Reviewed: 12/11/2011 [facility] Patient Information 2015 [place]. This information is not intended to replace advice given to you by your health care provider. Make sure you discuss any questions you have with your health care provider.

## 2014-01-11 NOTE — Discharge Summary (Signed)
Patient ID: Dylan Parsons MRN: [id number] DOB/AGE: June 21, 1986 28 y.o.  Admit date: 01/08/2014 Discharge date: 01/11/2014  Procedures: percutaneous drain placement in abdominal abscess on 01-09-14  Consults: IR  Reason for Admission: 28 yo wm s/p lap appy for ruptured appendicitis 5/30 comes in complaining of abd pain, fatigue, subjective fever and chills. Was doing well. Noticed on Friday morning that he had pain in upper abd that caused him to double over after eating part of apple and fiber bar. Resolved after awhile. Was able to eat lunch. On way home from work became very fatigue, cold, and abd pain returned. Took ibuprofen and went to bed. This am, pain had returned to upper and lower abdomen. crampy and constant. Been having frequent loose stools and some discomfort when urinating. Was on broad spectrum abx after surgery due to cellulitis.   Admission Diagnoses:  1. Intra-abdominal abscess, s/p lap appy for perforated appendicitis on 12-18-13 2. diarrhea  Hospital Course: The patient was admitted and IR was consulted for drain placement.  This was done the following day.  He was also checked for c diff colitis which was positive.  He was started on oral flagyl and kept on his IV zosyn.  Cultures are still negative to date.  On HD #5, the patient was stable and tolerating a regular diet.  His stools had begun to form.  He was stable for dc home.  He will get a repeat CT scan next week with hopeful drain removal at that time.  PE: Abd: soft, minimally tender, drain with serosang output, ND  Discharge Diagnoses:  Active Problems:   Intra-abdominal abscess   C. difficile diarrhea s/p perc drain  Discharge Medications:   Medication List         ciprofloxacin 500 MG tablet  Commonly known as:  CIPRO  Take 1 tablet (500 mg total) by mouth 2 (two) times daily.     ibuprofen 200 MG tablet  Commonly known as:  ADVIL,MOTRIN  Take 600 mg by mouth every 6 (six) hours as needed for  moderate pain.     metroNIDAZOLE 500 MG tablet  Commonly known as:  FLAGYL  Take 1 tablet (500 mg total) by mouth every 8 (eight) hours.     oxyCODONE-acetaminophen 5-325 MG per tablet  Commonly known as:  ROXICET  Take 1-2 tablets by mouth every 4 (four) hours as needed for severe pain.     valACYclovir 500 MG tablet  Commonly known as:  VALTREX  Take 500 mg by mouth 2 (two) times a week. Monday and Friday        Discharge Instructions:     Follow-up Information   Follow up with Cherylynn RidgesWYATT, JAMES O, MD On 01/25/2014. (11:40am, arrive by 11:20am)    Specialty:  General Surgery   Contact information:   [address], [address]  [facility] [place] [id number] [phone]      Signed: Letha CapeOSBORNE,KELLY E 01/11/2014, 9:33 AM  Agree with above. He looks good. Plan as reviewed with Dr. Lindie SpruceWyatt.  Ovidio Kinavid Newman, MD, [facility] Pager: [phone] Office phone:  [phone]

## 2014-01-11 NOTE — Progress Notes (Signed)
Dylan Parsons to be D/C'd Home per MD order.  Discussed with the patient and all questions fully answered.    Medication List         ciprofloxacin 500 MG tablet  Commonly known as:  CIPRO  Take 1 tablet (500 mg total) by mouth 2 (two) times daily.     ibuprofen 200 MG tablet  Commonly known as:  ADVIL,MOTRIN  Take 600 mg by mouth every 6 (six) hours as needed for moderate pain.     metroNIDAZOLE 500 MG tablet  Commonly known as:  FLAGYL  Take 1 tablet (500 mg total) by mouth every 8 (eight) hours.     oxyCODONE-acetaminophen 5-325 MG per tablet  Commonly known as:  ROXICET  Take 1-2 tablets by mouth every 4 (four) hours as needed for severe pain.     valACYclovir 500 MG tablet  Commonly known as:  VALTREX  Take 500 mg by mouth 2 (two) times a week. Monday and Friday        VSS, Skin clean, dry and intact without evidence of skin break down, no evidence of skin tears noted.  JP drain intact and patent.  Patient taught about flushing drain and recording drainage amounts at home. Prescriptions received. IV catheter discontinued intact. Site without signs and symptoms of complications. Dressing and pressure applied.  An After Visit Summary was printed and given to the patient.  D/c education completed with patient/family including follow up instructions, medication list, d/c activities limitations if indicated, with other d/c instructions as indicated by MD - patient able to verbalize understanding, all questions fully answered.   Patient instructed to return to ED, call 911, or call MD for any changes in condition.   Patient escorted via WC, and D/C home via private auto.  Burt EkCook, Taylor D 01/11/2014 10:50 AM

## 2014-01-11 NOTE — Progress Notes (Signed)
Subjective: RLQ TG abscess drain placed 6/21 Doing well really Eating well; +BM Pain only at drain site  Objective: Vital signs in last 24 hours: Temp:  [98 F (36.7 C)-98.1 F (36.7 C)] 98.1 F (36.7 C) (06/23 16100638) Pulse Rate:  [63-64] 63 (06/23 0638) Resp:  [16] 16 (06/23 [id number]) BP: (116-118)/(62-65) 118/65 mmHg (06/23 0638) SpO2:  [100 %] 100 % (06/23 54090638) Last BM Date: 01/08/14  Intake/Output from previous day: 06/22 0701 - 06/23 0700 In: 1230 [P.O.:1220] Out: 50 [Drains:50] Intake/Output this shift:    PE: afeb; vss Output 50 cc blood tinged fluid 20 cc in JP Site clean and dry; tender to touch Wbc wnl Cx: no growth   Lab Results:   Recent Labs  01/09/14 0305 01/11/14 0500  WBC 6.6 4.0  HGB 13.3 13.5  HCT 38.0* 38.7*  PLT 258 203   BMET  Recent Labs  01/08/14 1225 01/09/14 0305  NA 140 139  K 4.4 4.3  CL 101 101  CO2 27 27  GLUCOSE 129* 94  BUN 7 5*  CREATININE 0.84 1.08  CALCIUM 9.6 9.2   PT/INR No results found for this basename: LABPROT, INR,  in the last 72 hours ABG No results found for this basename: PHART, PCO2, PO2, HCO3,  in the last 72 hours  Studies/Results: Ct Image Guided Fluid Drain By Catheter  01/10/2014   CLINICAL DATA:  [age] year old male with history of acute appendicitis status post appendectomy on 12/18/2013. His postoperative course was complicated by formation of a peripherally enhancing fluid collection in the deep anatomic pelvis concerning for abscess. Percutaneous drainage is warranted.  EXAM: CT IMAGE GUIDED FLUID DRAIN BY CATHETER  Date: 01/10/2014  PROCEDURE: 1. CT-guided placement of a right trans gluteal percutaneous drain Interventional Radiologist:  Sterling BigHeath K. McCullough, MD  ANESTHESIA/SEDATION: Moderate (conscious) sedation was used. Six mg Versed, 200 mcg Fentanyl were administered intravenously. The patient's vital signs were monitored continuously by radiology nursing throughout the procedure.  Sedation  Time: 30 minutes  TECHNIQUE: Informed consent was obtained from the patient following explanation of the procedure, risks, benefits and alternatives. The patient understands, agrees and consents for the procedure. All questions were addressed. A time out was performed.  A planning axial CT scan was performed. The fluid collection in the recto vesicular recess was successfully identified. A suitable skin entry site was selected and marked the will allow passage through the sacrospinous ligament medial to the sciatic nerve.  The right gluteal region was sterilely prepped and draped in the usual fashion using Betadine skin prep. Local anesthesia was attained with infiltration of 1% lidocaine. Under intermittent CT fluoroscopic guidance, an 18 gauge trocar needle was advanced into the fluid collection. There was return of purulent material through the needle hub. A 0.035 Amplatz wire was coiled within the abscess collection. The skin tract was then serially dilated to 12 [place] and a Cook 12 [place] all-purpose drainage catheter was advanced into the abscess collection. The locking loop was formed in approximately 45 mL of frankly purulent fluid was abscess. The abscess cavity was then lavaged serially with sterile saline and connected to JP bulb suction. The tube was secured to the skin with 0 Prolene suture and an adhesive fixation device. The patient tolerated the procedure very well. The aspirated fluid was sent for culture.  COMPLICATIONS: None immediate.  IMPRESSION: Technically successful placement of a right trans gluteal approach 12 French drainage catheter into the recto vesicular abscess. Approximately 45 mL of frankly purulent material  was aspirated. A sample was sent for culture and sensitivities.  Signed,  Sterling BigHeath K. McCullough, MD  [facility]  [facility]   Electronically Signed   By: Malachy MoanHeath  McCullough M.D.   On: 01/10/2014 09:00     Anti-infectives: Anti-infectives   Start     Dose/Rate Route Frequency Ordered Stop   01/08/14 2200  piperacillin-tazobactam (ZOSYN) IVPB 3.375 g     3.375 g 12.5 mL/hr over 240 Minutes Intravenous Every 8 hours 01/08/14 1605     01/08/14 2200  metroNIDAZOLE (FLAGYL) tablet 500 mg     500 mg Oral 3 times per day 01/08/14 2127 01/22/14 2159   01/08/14 1615  piperacillin-tazobactam (ZOSYN) IVPB 3.375 g     3.375 g 100 mL/hr over 30 Minutes Intravenous  Once 01/08/14 1605 01/08/14 1700      Assessment/Plan: s/p * No surgery found *  RLQ abscess TG drain placed 6/21 Doing well Plan per [facility] Drain need to remain til output 10 cc/day  Re CT then   LOS: 3 days    Caster Fayette A 01/11/2014

## 2014-01-12 ENCOUNTER — Other Ambulatory Visit (INDEPENDENT_AMBULATORY_CARE_PROVIDER_SITE_OTHER): Payer: Self-pay | Admitting: General Surgery

## 2014-01-12 DIAGNOSIS — IMO0001 Reserved for inherently not codable concepts without codable children: Secondary | ICD-10-CM

## 2014-01-12 DIAGNOSIS — T814XXD Infection following a procedure, subsequent encounter: Principal | ICD-10-CM

## 2014-01-12 DIAGNOSIS — K651 Peritoneal abscess: Principal | ICD-10-CM

## 2014-01-12 LAB — CULTURE, ROUTINE-ABSCESS: Gram Stain: NONE SEEN

## 2014-01-14 ENCOUNTER — Telehealth (INDEPENDENT_AMBULATORY_CARE_PROVIDER_SITE_OTHER): Payer: Self-pay | Admitting: General Surgery

## 2014-01-14 NOTE — Telephone Encounter (Signed)
He called concerned about the side effect of pancreatitis from Flagyl he is taking.  I told he I felt this was a very rare side effect and essentially the benefit far outweighed the risk.  Thus, I recommended that he continue to take it.  He seemed to understand.

## 2014-01-18 ENCOUNTER — Ambulatory Visit
Admission: RE | Admit: 2014-01-18 | Discharge: 2014-01-18 | Disposition: A | Discharge status: Home / Self Care | Payer: 59 | Source: Ambulatory Visit | Attending: General Surgery | Admitting: General Surgery

## 2014-01-18 ENCOUNTER — Telehealth (INDEPENDENT_AMBULATORY_CARE_PROVIDER_SITE_OTHER): Payer: Self-pay

## 2014-01-18 ENCOUNTER — Telehealth (INDEPENDENT_AMBULATORY_CARE_PROVIDER_SITE_OTHER): Payer: Self-pay | Admitting: General Surgery

## 2014-01-18 DIAGNOSIS — IMO0001 Reserved for inherently not codable concepts without codable children: Secondary | ICD-10-CM

## 2014-01-18 DIAGNOSIS — T814XXD Infection following a procedure, subsequent encounter: Principal | ICD-10-CM

## 2014-01-18 DIAGNOSIS — K651 Peritoneal abscess: Principal | ICD-10-CM

## 2014-01-18 MED ORDER — IOHEXOL 300 MG/ML  SOLN
100.0000 mL | Freq: Once | INTRAMUSCULAR | Status: AC | PRN
  Administered 2014-01-18: 100 mL via INTRAVENOUS

## 2014-01-18 NOTE — Telephone Encounter (Signed)
Called patient back and told him per Dr. Lindie SpruceWyatt the he can DC Cipro and finish up his Flagyl. The patient has 5 days left of the Flagyl. And I asked per Dr Lindie SpruceWyatt how is his BM and the patient stated that they are soft, not runny .

## 2014-01-18 NOTE — Telephone Encounter (Signed)
Pt s/p percutaneous drain placement in abdominal abscess on 01-09-14. Pt states that he has finished his Cipro and wanted to know if he needs to continue to take the Flagyl. Pt states that he has been hospitalized before because of taking too many abx's. Pt has a follow-up appt with Dr Lindie SpruceWyatt on 01/25/14. Advised pt that I would send Dr Lindie SpruceWyatt a message. Please advise.

## 2014-01-25 ENCOUNTER — Encounter (INDEPENDENT_AMBULATORY_CARE_PROVIDER_SITE_OTHER): Payer: Self-pay | Admitting: General Surgery

## 2014-01-25 ENCOUNTER — Ambulatory Visit (INDEPENDENT_AMBULATORY_CARE_PROVIDER_SITE_OTHER): Payer: BC Managed Care – PPO | Admitting: General Surgery

## 2014-01-25 VITALS — BP 125/73 | HR 76 | Temp 98.6°F | Resp 18 | Ht 72.0 in | Wt 161.6 lb

## 2014-01-25 DIAGNOSIS — Z09 Encounter for follow-up examination after completed treatment for conditions other than malignant neoplasm: Secondary | ICD-10-CM

## 2014-01-25 NOTE — Progress Notes (Signed)
Subjective:     Patient ID: Dylan Parsons, male   DOB: 1985-09-07, 28 y.o.   MRN: [id number]  HPI The patient is doing much better however he is a bit paranoid about developing a problem because he was minimally symptomatic when he had his pelvic abscess before.  Review of Systems Afebrile. No chills no fevers. No nausea vomiting. No abdominal discomfort. No dysuria or difficulty or pain with defecation.    Objective:   Physical Exam All wound sites are well-healed with no evidence of infection. Good bowel sounds. Nontender.    Assessment:     Recovering from postop lap appendectomy for ruptured appendicitis. Postoperative complication of a pelvic abscess resolved.     Plan:     Patient to call if he should have any symptoms otherwise he is released from clinic.

## 2014-03-10 ENCOUNTER — Telehealth (INDEPENDENT_AMBULATORY_CARE_PROVIDER_SITE_OTHER): Payer: Self-pay

## 2014-03-10 NOTE — Telephone Encounter (Signed)
Pt s/p lap appy on 12/18/13 and previous pelvic abscess. Pt states that he has started having abdominal pain when he needs to urinate and have a BM. Pt states that he feels fine other than at those times. Pt states that he he was minimally symptomatic when he had his pelvic abscess before. Informed pt that I would send Dr Lindie SpruceWyatt a message to see if he would want the pt to have a CT scan or he wants the pt to go through his PCP and we would contact the pt as soon as we receive a response. Pt verbalized understanding and agrees with the POC.

## 2014-03-11 ENCOUNTER — Other Ambulatory Visit (INDEPENDENT_AMBULATORY_CARE_PROVIDER_SITE_OTHER): Payer: Self-pay

## 2014-03-11 DIAGNOSIS — R1030 Lower abdominal pain, unspecified: Secondary | ICD-10-CM

## 2014-03-11 NOTE — Telephone Encounter (Signed)
Called pt and let him know Dr Lindie SpruceWyatt would like to get CT abd/pelvis. Order in epic and given to [place] Farms[place]ennifer. She will call with appt.

## 2014-03-17 ENCOUNTER — Other Ambulatory Visit: Payer: 59

## 2014-03-21 ENCOUNTER — Ambulatory Visit
Admission: RE | Admit: 2014-03-21 | Discharge: 2014-03-21 | Disposition: A | Discharge status: Home / Self Care | Payer: BC Managed Care – PPO | Source: Ambulatory Visit | Attending: General Surgery | Admitting: General Surgery

## 2014-03-21 DIAGNOSIS — R1030 Lower abdominal pain, unspecified: Secondary | ICD-10-CM

## 2014-03-21 MED ORDER — IOHEXOL 300 MG/ML  SOLN
100.0000 mL | Freq: Once | INTRAMUSCULAR | Status: AC | PRN
  Administered 2014-03-21: 100 mL via INTRAVENOUS

## 2014-04-05 ENCOUNTER — Telehealth (INDEPENDENT_AMBULATORY_CARE_PROVIDER_SITE_OTHER): Payer: Self-pay

## 2014-04-05 NOTE — Telephone Encounter (Signed)
Pt is calling back stating that he is still having some off and on pain. Informed pt that this can happen after sx. Pt requests that Dr Lindie Spruce give him a call. Pt denies any fever, chills, n/v. Informed pt that I would send Dr Lindie Spruce a message. Pt verbalized understanding

## 2015-03-14 ENCOUNTER — Emergency Department (HOSPITAL_COMMUNITY): Payer: BLUE CROSS/BLUE SHIELD

## 2015-03-14 ENCOUNTER — Emergency Department (HOSPITAL_COMMUNITY)
Admission: EM | Admit: 2015-03-14 | Discharge: 2015-03-14 | Disposition: A | Discharge status: Home / Self Care | Payer: BLUE CROSS/BLUE SHIELD | Attending: Emergency Medicine | Admitting: Emergency Medicine

## 2015-03-14 ENCOUNTER — Encounter (HOSPITAL_COMMUNITY): Payer: Self-pay

## 2015-03-14 DIAGNOSIS — Z8659 Personal history of other mental and behavioral disorders: Secondary | ICD-10-CM | POA: Insufficient documentation

## 2015-03-14 DIAGNOSIS — Z9889 Other specified postprocedural states: Secondary | ICD-10-CM | POA: Diagnosis not present

## 2015-03-14 DIAGNOSIS — R609 Edema, unspecified: Secondary | ICD-10-CM

## 2015-03-14 DIAGNOSIS — R2241 Localized swelling, mass and lump, right lower limb: Secondary | ICD-10-CM | POA: Diagnosis present

## 2015-03-14 DIAGNOSIS — K4090 Unilateral inguinal hernia, without obstruction or gangrene, not specified as recurrent: Secondary | ICD-10-CM | POA: Insufficient documentation

## 2015-03-14 DIAGNOSIS — Z79899 Other long term (current) drug therapy: Secondary | ICD-10-CM | POA: Insufficient documentation

## 2015-03-14 LAB — URINALYSIS, ROUTINE W REFLEX MICROSCOPIC
Bilirubin Urine: NEGATIVE
Glucose, UA: NEGATIVE mg/dL
Hgb urine dipstick: NEGATIVE
Ketones, ur: NEGATIVE mg/dL
LEUKOCYTES UA: NEGATIVE
Nitrite: NEGATIVE
PROTEIN: NEGATIVE mg/dL
SPECIFIC GRAVITY, URINE: 1.02 (ref 1.005–1.030)
UROBILINOGEN UA: 0.2 mg/dL (ref 0.0–1.0)
pH: 6 (ref 5.0–8.0)

## 2015-03-14 NOTE — ED Notes (Signed)
Patient c/o right groin hernia x 2 weeks and bulging has increased. Patient states the pain is worse when he coughs, urinates, or defacating.

## 2015-03-14 NOTE — Discharge Instructions (Signed)
Please return without fail for worsening symptoms, including worsening pain, vomiting unable to keep down food or fluids, fevers, or any other symptoms concerning to you. Call your surgeon to discuss potential elective repair of this hernia.   Inguinal Hernia, Adult Muscles help keep everything in the body in its proper place. But if a weak spot in the muscles develops, something can poke through. That is called a hernia. When this happens in the lower part of the belly (abdomen), it is called an inguinal hernia. (It takes its name from a part of the body in this region called the inguinal canal.) A weak spot in the wall of muscles lets some fat or part of the small intestine bulge through. An inguinal hernia can develop at any age. Men get them more often than women. CAUSES  In adults, an inguinal hernia develops over time.  It can be triggered by:  Suddenly straining the muscles of the lower abdomen.  Lifting heavy objects.  Straining to have a bowel movement. Difficult bowel movements (constipation) can lead to this.  Constant coughing. This may be caused by smoking or lung disease.  Being overweight.  Being pregnant.  Working at a job that requires long periods of standing or heavy lifting.  Having had an inguinal hernia before. One type can be an emergency situation. It is called a strangulated inguinal hernia. It develops if part of the small intestine slips through the weak spot and cannot get back into the abdomen. The blood supply can be cut off. If that happens, part of the intestine may die. This situation requires emergency surgery. SYMPTOMS  Often, a small inguinal hernia has no symptoms. It is found when a healthcare provider does a physical exam. Larger hernias usually have symptoms.   In adults, symptoms may include:  A lump in the groin. This is easier to see when the person is standing. It might disappear when lying down.  In men, a lump in the scrotum.  Pain or  burning in the groin. This occurs especially when lifting, straining or coughing.  A dull ache or feeling of pressure in the groin.  Signs of a strangulated hernia can include:  A bulge in the groin that becomes very painful and tender to the touch.  A bulge that turns red or purple.  Fever, nausea and vomiting.  Inability to have a bowel movement or to pass gas. DIAGNOSIS  To decide if you have an inguinal hernia, a healthcare provider will probably do a physical examination.  This will include asking questions about any symptoms you have noticed.  The healthcare provider might feel the groin area and ask you to cough. If an inguinal hernia is felt, the healthcare provider may try to slide it back into the abdomen.  Usually no other tests are needed. TREATMENT  Treatments can vary. The size of the hernia makes a difference. Options include:  Watchful waiting. This is often suggested if the hernia is small and you have had no symptoms.  No medical procedure will be done unless symptoms develop.  You will need to watch closely for symptoms. If any occur, contact your healthcare provider right away.  Surgery. This is used if the hernia is larger or you have symptoms.  Open surgery. This is usually an outpatient procedure (you will not stay overnight in a hospital). An cut (incision) is made through the skin in the groin. The hernia is put back inside the abdomen. The weak area in the  muscles is then repaired by herniorrhaphy or hernioplasty. Herniorrhaphy: in this type of surgery, the weak muscles are sewn back together. Hernioplasty: a patch or mesh is used to close the weak area in the abdominal wall.  Laparoscopy. In this procedure, a surgeon makes small incisions. A thin tube with a tiny video camera (called a laparoscope) is put into the abdomen. The surgeon repairs the hernia with mesh by looking with the video camera and using two long instruments. HOME CARE INSTRUCTIONS    After surgery to repair an inguinal hernia:  You will need to take pain medicine prescribed by your healthcare provider. Follow all directions carefully.  You will need to take care of the wound from the incision.  Your activity will be restricted for awhile. This will probably include no heavy lifting for several weeks. You also should not do anything too active for a few weeks. When you can return to work will depend on the type of job that you have.  During "watchful waiting" periods, you should:  Maintain a healthy weight.  Eat a diet high in fiber (fruits, vegetables and whole grains).  Drink plenty of fluids to avoid constipation. This means drinking enough water and other liquids to keep your urine clear or pale yellow.  Do not lift heavy objects.  Do not stand for long periods of time.  Quit smoking. This should keep you from developing a frequent cough. SEEK MEDICAL CARE IF:   A bulge develops in your groin area.  You feel pain, a burning sensation or pressure in the groin. This might be worse if you are lifting or straining.  You develop a fever of more than 100.5 F (38.1 C). SEEK IMMEDIATE MEDICAL CARE IF:   Pain in the groin increases suddenly.  A bulge in the groin gets bigger suddenly and does not go down.  For men, there is sudden pain in the scrotum. Or, the size of the scrotum increases.  A bulge in the groin area becomes red or purple and is painful to touch.  You have nausea or vomiting that does not go away.  You feel your heart beating much faster than normal.  You cannot have a bowel movement or pass gas.  You develop a fever of more than 102.0 F (38.9 C). Document Released: 11/24/2008 Document Revised: 09/30/2011 Document Reviewed: 11/24/2008 [facility] Patient Information 2015 [place], [place]. This information is not intended to replace advice given to you by your health care provider. Make sure you discuss any questions you have with your  health care provider.

## 2015-03-14 NOTE — ED Notes (Signed)
Discharge instructions given and reviewed with patient.  Patient verbalized understanding to follow up with surgeon for further evaluation of hernia.  Patient discharged home in good condition.

## 2015-03-14 NOTE — ED Provider Notes (Signed)
CSN: [id number]     Arrival date & time 03/14/15  0758 History   First MD Initiated Contact with Patient 03/14/15 [phone]     Chief Complaint  Patient presents with  . Hernia     (Consider location/radiation/quality/duration/timing/severity/associated sxs/prior Treatment) HPI  29 year old male who presents with hernia. History of lap appendectomy. Reports new onset of right groin swelling for the past few days. Reports sharp pain associated with coughing, laughing, and having bowel movement. He is able to reduce the swelling. Denies any dysuria, urinary frequency abnormal penile discharge or bleeding. Denies any scrotal swelling or testicular pain. Has not had any nausea, vomiting, fevers, abdominal distention, diarrhea or constipation.  Past Medical History  Diagnosis Date  . Complication of anesthesia     "anesthesia wears off rapidly"  . Family history of anesthesia complication     "anesthesia wears off rapidly for my mom"  . Depression    Past Surgical History  Procedure Laterality Date  . Laparoscopic appendectomy N/A 12/18/2013    Procedure: APPENDECTOMY LAPAROSCOPIC;  Surgeon: Cherylynn Ridges, MD;  Location: [facility] OR;  Service: General;  Laterality: N/A;  . Appendectomy    . Wisdom tooth extraction  2000's    "all 4"   History reviewed. No pertinent family history. Social History  Substance Use Topics  . Smoking status: Never Smoker   . Smokeless tobacco: Never Used  . Alcohol Use: 4.2 oz/week    7 Cans of beer per week    Review of Systems 10/14 systems reviewed and are negative other than those stated in the HPI  Allergies  Review of patient's allergies indicates no known allergies.  Home Medications   Prior to Admission medications   Medication Sig Start Date End Date Taking? Authorizing Provider  Calcium Carb-Cholecalciferol (CALCIUM + D3 PO) Take 1 tablet by mouth daily.   Yes Historical Provider, MD  Multiple Vitamin (MULTIVITAMIN) tablet Take 1 tablet by mouth  daily.   Yes Historical Provider, MD  ibuprofen (ADVIL,MOTRIN) 200 MG tablet Take 600 mg by mouth every 6 (six) hours as needed for moderate pain.    Historical Provider, MD   BP 127/84 mmHg  Pulse 72  Temp(Src) 98 F (36.7 C) (Oral)  Resp 16  Ht 6' (1.829 m)  Wt 170 lb (77.111 kg)  BMI 23.05 kg/m2  SpO2 100% Physical Exam Physical Exam  Nursing note and vitals reviewed. Constitutional: Well developed, well nourished, non-toxic, and in no acute distress Head: Normocephalic and atraumatic.  Mouth/Throat: Oropharynx is clear and moist.  Neck: Normal range of motion. Neck supple.  Cardiovascular: Normal rate and regular rhythm.   Pulmonary/Chest: Effort normal and breath sounds normal.  Abdominal: Soft. There is no tenderness. There is no rebound and no guarding.  GU: Minimal right groin swelling at the level of the inguinal ligament, that is reducible. No significant tenderness. No testicular swelling/pain. Normal external genitalia. No discharge. Musculoskeletal: Normal range of motion.  Neurological: Alert, no facial droop, fluent speech, moves all extremities symmetrically Skin: Skin is warm and dry.  Psychiatric: Cooperative  ED Course  Procedures (including critical care time) Labs Review Labs Reviewed  URINALYSIS, ROUTINE W REFLEX MICROSCOPIC (NOT AT [facility])    Imaging Review US Pelvis Limited  03/14/2015   CLINICAL DATA:  Right groin swelling in the morning. This can be reduced.  EXAM: US PELVIS LIMITED  TECHNIQUE: Ultrasound examination of the pelvic soft tissues was performed in the area of clinical concern.  COMPARISON:  CT 03/21/2014  FINDINGS: Fatty tissue noted within the right inguinal canal which appears to increase with Valsalva suggesting herniated fat.  IMPRESSION: Suspicious for right inguinal hernia and herniated fat, increasing with Valsalva.   Electronically Signed   By: Charlett Nose M.D.   On: 03/14/2015 10:01   I have personally reviewed and evaluated these  images and lab results as part of my medical decision-making.   MDM   Final diagnoses:  Unilateral inguinal hernia without obstruction or gangrene, recurrence not specified    29 year old male, history of lap appendectomy, who presents with right groin swelling and pain. He is well-appearing, nontoxic, in no acute distress. Vital signs are within normal limits. He has exam has a soft and nontender abdomen. There is an area of swelling associated with Valsalva in the right inguinal region, that is easily reducible. This is consistent with likely inguinal hernia. Ultrasound of this area is consistent with such as well. He is given instructions regarding manual reduction and signs and symptoms of potential obstruction. He will discuss potential elective repair with his surgeon as needed.    Lavera Guise, MD 03/14/15 1017

## 2015-04-11 ENCOUNTER — Ambulatory Visit: Payer: Self-pay | Admitting: General Surgery

## 2015-05-03 ENCOUNTER — Encounter (HOSPITAL_COMMUNITY): Payer: Self-pay

## 2015-05-03 ENCOUNTER — Encounter (HOSPITAL_COMMUNITY)
Admission: RE | Admit: 2015-05-03 | Discharge: 2015-05-03 | Disposition: A | Discharge status: Home / Self Care | Payer: BLUE CROSS/BLUE SHIELD | Source: Ambulatory Visit | Attending: General Surgery | Admitting: General Surgery

## 2015-05-03 ENCOUNTER — Other Ambulatory Visit (HOSPITAL_COMMUNITY): Payer: Self-pay | Admitting: *Deleted

## 2015-05-03 DIAGNOSIS — K4090 Unilateral inguinal hernia, without obstruction or gangrene, not specified as recurrent: Secondary | ICD-10-CM | POA: Diagnosis present

## 2015-05-03 HISTORY — DX: Personal history of other infectious and parasitic diseases: Z86.19

## 2015-05-03 LAB — CBC
HCT: 46.7 % (ref 39.0–52.0)
HEMOGLOBIN: 16.7 g/dL (ref 13.0–17.0)
MCH: 31.1 pg (ref 26.0–34.0)
MCHC: 35.8 g/dL (ref 30.0–36.0)
MCV: 87 fL (ref 78.0–100.0)
PLATELETS: 161 10*3/uL (ref 150–400)
RBC: 5.37 MIL/uL (ref 4.22–5.81)
RDW: 12.4 % (ref 11.5–15.5)
WBC: 6 10*3/uL (ref 4.0–10.5)

## 2015-05-03 NOTE — Progress Notes (Signed)
Pt denies cardiac history. Pt does have a hx of c-diff and staph infection after his appendectomy last year.

## 2015-05-03 NOTE — Pre-Procedure Instructions (Signed)
Tobie PoetDylan Trimarco  05/03/2015      Your procedure is scheduled on Friday, May 05, 2015 at 8:00 AM.   [facility] Entr[facility] "A" Admitting Office at 6:00 AM.   Call this number if you have problems the morning of surgery: [phone]    Remember:  Do not eat food or drink liquids after midnight Thursday.   Do not wear jewelry.  Do not wear lotions, powders, or cologne.  You may wear deodorant.  Men may shave face and neck.  Do not bring valuables to the hospital.  [facility] is not responsible for any belongings or valuables.  Contacts, dentures or bridgework may not be worn into surgery.  Leave your suitcase in the car.  After surgery it may be brought to your room.  For patients admitted to the hospital, discharge time will be determined by your treatment team.  Patients discharged the day of surgery will not be allowed to drive home.   Special instructions:  [facility] - Preparing for Surgery  Before surgery, you can play an important role.  Because skin is not sterile, your skin needs to be as free of germs as possible.  You can reduce the number of germs on you skin by washing with CHG (chlorahexidine gluconate) soap before surgery.  CHG is an antiseptic cleaner which kills germs and bonds with the skin to continue killing germs even after washing.  Please DO NOT use if you have an allergy to CHG or antibacterial soaps.  If your skin becomes reddened/irritated stop using the CHG and inform your nurse when you arrive at Short Stay.  Do not shave (including legs and underarms) for at least 48 hours prior to the first CHG shower.  You may shave your face.  Please follow these instructions carefully:   1.  Shower with CHG Soap the night before surgery and the                                morning of Surgery.  2.  If you choose to wash your hair, wash your hair first as usual with your       normal shampoo.  3.  After you shampoo, rinse your hair and body  thoroughly to remove the                      Shampoo.  4.  Use CHG as you would any other liquid soap.  You can apply chg directly       to the skin and wash gently with scrungie or a clean washcloth.  5.  Apply the CHG Soap to your body ONLY FROM THE NECK DOWN.        Do not use on open wounds or open sores.  Avoid contact with your eyes, ears, mouth and genitals (private parts).  Wash genitals (private parts) with your normal soap.  6.  Wash thoroughly, paying special attention to the area where your surgery        will be performed.  7.  Thoroughly rinse your body with warm water from the neck down.  8.  DO NOT shower/wash with your normal soap after using and rinsing off       the CHG Soap.  9.  Pat yourself dry with a clean towel.            10.  Wear clean  pajamas.            11.  Place clean sheets on your bed the night of your first shower and do not        sleep with pets.  Day of Surgery  Do not apply any lotions the morning of surgery.  Please wear clean clothes to the hospital.  Please read over the following fact sheets that you were given. Pain Booklet, Coughing and Deep Breathing and Surgical Site Infection Prevention

## 2015-05-04 MED ORDER — CEFAZOLIN SODIUM-DEXTROSE 2-3 GM-% IV SOLR
2.0000 g | INTRAVENOUS | Status: AC
  Administered 2015-05-05: 2 g via INTRAVENOUS
  Filled 2015-05-04: qty 50

## 2015-05-04 MED ORDER — CHLORHEXIDINE GLUCONATE 4 % EX LIQD: 1.0000 "application " | Freq: Once | CUTANEOUS | Status: DC

## 2015-05-05 ENCOUNTER — Ambulatory Visit (HOSPITAL_COMMUNITY)
Admission: RE | Admit: 2015-05-05 | Discharge: 2015-05-05 | Disposition: A | Discharge status: Home / Self Care | Payer: BLUE CROSS/BLUE SHIELD | Source: Ambulatory Visit | Attending: General Surgery | Admitting: General Surgery

## 2015-05-05 ENCOUNTER — Encounter (HOSPITAL_COMMUNITY)
Admission: RE | Disposition: A | Discharge status: Home / Self Care | Payer: Self-pay | Source: Ambulatory Visit | Attending: General Surgery

## 2015-05-05 ENCOUNTER — Ambulatory Visit (HOSPITAL_COMMUNITY): Payer: BLUE CROSS/BLUE SHIELD | Admitting: Certified Registered Nurse Anesthetist

## 2015-05-05 ENCOUNTER — Encounter (HOSPITAL_COMMUNITY): Payer: Self-pay | Admitting: *Deleted

## 2015-05-05 DIAGNOSIS — K4090 Unilateral inguinal hernia, without obstruction or gangrene, not specified as recurrent: Secondary | ICD-10-CM | POA: Insufficient documentation

## 2015-05-05 HISTORY — PX: INGUINAL HERNIA REPAIR: SHX194

## 2015-05-05 SURGERY — REPAIR, HERNIA, INGUINAL, LAPAROSCOPIC
Anesthesia: General | Site: Groin | Laterality: Right

## 2015-05-05 MED ORDER — PROPOFOL 10 MG/ML IV BOLUS
INTRAVENOUS | Status: DC | PRN
  Administered 2015-05-05: 20 mg via INTRAVENOUS
  Administered 2015-05-05: 200 mg via INTRAVENOUS

## 2015-05-05 MED ORDER — HYDROCODONE-ACETAMINOPHEN 5-325 MG PO TABS: 1.0000 | ORAL_TABLET | ORAL | Status: AC | PRN

## 2015-05-05 MED ORDER — ONDANSETRON HCL 4 MG/2ML IJ SOLN
INTRAMUSCULAR | Status: DC | PRN
  Administered 2015-05-05: 4 mg via INTRAVENOUS

## 2015-05-05 MED ORDER — BUPIVACAINE-EPINEPHRINE (PF) 0.25% -1:200000 IJ SOLN
INTRAMUSCULAR | Status: AC
  Filled 2015-05-05: qty 30

## 2015-05-05 MED ORDER — FENTANYL CITRATE (PF) 250 MCG/5ML IJ SOLN
INTRAMUSCULAR | Status: AC
  Filled 2015-05-05: qty 5

## 2015-05-05 MED ORDER — LACTATED RINGERS IV SOLN: INTRAVENOUS | Status: DC

## 2015-05-05 MED ORDER — LIDOCAINE HCL (CARDIAC) 20 MG/ML IV SOLN
INTRAVENOUS | Status: DC | PRN
  Administered 2015-05-05: 100 mg via INTRAVENOUS

## 2015-05-05 MED ORDER — LIDOCAINE HCL (CARDIAC) 20 MG/ML IV SOLN
INTRAVENOUS | Status: AC
  Filled 2015-05-05: qty 5

## 2015-05-05 MED ORDER — PROMETHAZINE HCL 25 MG/ML IJ SOLN: 6.2500 mg | INTRAMUSCULAR | Status: DC | PRN

## 2015-05-05 MED ORDER — MEPERIDINE HCL 25 MG/ML IJ SOLN: 6.2500 mg | INTRAMUSCULAR | Status: DC | PRN

## 2015-05-05 MED ORDER — ROCURONIUM BROMIDE 50 MG/5ML IV SOLN
INTRAVENOUS | Status: AC
  Filled 2015-05-05: qty 1

## 2015-05-05 MED ORDER — ONDANSETRON HCL 4 MG/2ML IJ SOLN
INTRAMUSCULAR | Status: AC
  Filled 2015-05-05: qty 2

## 2015-05-05 MED ORDER — ARTIFICIAL TEARS OP OINT
TOPICAL_OINTMENT | OPHTHALMIC | Status: AC
  Filled 2015-05-05: qty 3.5

## 2015-05-05 MED ORDER — ARTIFICIAL TEARS OP OINT
TOPICAL_OINTMENT | OPHTHALMIC | Status: DC | PRN
  Administered 2015-05-05: 1 via OPHTHALMIC

## 2015-05-05 MED ORDER — MIDAZOLAM HCL 5 MG/5ML IJ SOLN
INTRAMUSCULAR | Status: DC | PRN
  Administered 2015-05-05: 2 mg via INTRAVENOUS

## 2015-05-05 MED ORDER — FENTANYL CITRATE (PF) 100 MCG/2ML IJ SOLN
INTRAMUSCULAR | Status: DC | PRN
  Administered 2015-05-05: 250 ug via INTRAVENOUS
  Administered 2015-05-05: 50 ug via INTRAVENOUS

## 2015-05-05 MED ORDER — SUGAMMADEX SODIUM 200 MG/2ML IV SOLN
INTRAVENOUS | Status: AC
  Filled 2015-05-05: qty 2

## 2015-05-05 MED ORDER — PROPOFOL 10 MG/ML IV BOLUS
INTRAVENOUS | Status: AC
  Filled 2015-05-05: qty 20

## 2015-05-05 MED ORDER — SUCCINYLCHOLINE CHLORIDE 20 MG/ML IJ SOLN
INTRAMUSCULAR | Status: AC
  Filled 2015-05-05: qty 1

## 2015-05-05 MED ORDER — ROCURONIUM BROMIDE 100 MG/10ML IV SOLN
INTRAVENOUS | Status: DC | PRN
  Administered 2015-05-05: 10 mg via INTRAVENOUS
  Administered 2015-05-05: 50 mg via INTRAVENOUS

## 2015-05-05 MED ORDER — DEXAMETHASONE SODIUM PHOSPHATE 4 MG/ML IJ SOLN
INTRAMUSCULAR | Status: AC
  Filled 2015-05-05: qty 2

## 2015-05-05 MED ORDER — SUGAMMADEX SODIUM 200 MG/2ML IV SOLN
INTRAVENOUS | Status: DC | PRN
  Administered 2015-05-05: 200 mg via INTRAVENOUS

## 2015-05-05 MED ORDER — SODIUM CHLORIDE 0.9 % IR SOLN
Status: DC | PRN
  Administered 2015-05-05: 1000 mL

## 2015-05-05 MED ORDER — HYDROMORPHONE HCL 1 MG/ML IJ SOLN
0.2500 mg | INTRAMUSCULAR | Status: DC | PRN
  Administered 2015-05-05: 0.5 mg via INTRAVENOUS

## 2015-05-05 MED ORDER — HYDROMORPHONE HCL 1 MG/ML IJ SOLN
INTRAMUSCULAR | Status: AC
  Filled 2015-05-05: qty 1

## 2015-05-05 MED ORDER — LACTATED RINGERS IV SOLN
INTRAVENOUS | Status: DC | PRN
  Administered 2015-05-05 (×2): via INTRAVENOUS

## 2015-05-05 MED ORDER — BUPIVACAINE-EPINEPHRINE 0.25% -1:200000 IJ SOLN
INTRAMUSCULAR | Status: DC | PRN
  Administered 2015-05-05: 11 mL

## 2015-05-05 MED ORDER — SODIUM CHLORIDE 0.9 % IR SOLN
Status: DC | PRN
  Administered 2015-05-05: 500 mL

## 2015-05-05 MED ORDER — DEXAMETHASONE SODIUM PHOSPHATE 4 MG/ML IJ SOLN
INTRAMUSCULAR | Status: DC | PRN
  Administered 2015-05-05: 8 mg via INTRAVENOUS

## 2015-05-05 MED ORDER — MIDAZOLAM HCL 2 MG/2ML IJ SOLN
INTRAMUSCULAR | Status: AC
  Filled 2015-05-05: qty 4

## 2015-05-05 SURGICAL SUPPLY — 42 items
APPLIER CLIP 5 13 M/L LIGAMAX5 (MISCELLANEOUS)
BAG DECANTER FOR FLEXI CONT (MISCELLANEOUS) ×3 IMPLANT
CHLORAPREP W/TINT 26ML (MISCELLANEOUS) ×3 IMPLANT
CLIP APPLIE 5 13 M/L LIGAMAX5 (MISCELLANEOUS) IMPLANT
COVER SURGICAL LIGHT HANDLE (MISCELLANEOUS) ×3 IMPLANT
DECANTER SPIKE VIAL GLASS SM (MISCELLANEOUS) ×3 IMPLANT
DERMABOND ADVANCED (GAUZE/BANDAGES/DRESSINGS) ×2
DERMABOND ADVANCED .7 DNX12 (GAUZE/BANDAGES/DRESSINGS) ×1 IMPLANT
DEVICE SECURE STRAP 25 ABSORB (INSTRUMENTS) ×6 IMPLANT
DISSECT BALLN SPACEMKR + OVL (BALLOONS) ×3
DISSECTOR BALLN SPACEMKR + OVL (BALLOONS) ×1 IMPLANT
DISSECTOR BLUNT TIP ENDO 5MM (MISCELLANEOUS) IMPLANT
DRAPE LAPAROSCOPIC ABDOMINAL (DRAPES) ×3 IMPLANT
DRSG TEGADERM 4X4.75 (GAUZE/BANDAGES/DRESSINGS) ×3 IMPLANT
ELECT REM PT RETURN 9FT ADLT (ELECTROSURGICAL) ×3
ELECTRODE REM PT RTRN 9FT ADLT (ELECTROSURGICAL) ×1 IMPLANT
GLOVE BIO SURGEON STRL SZ 6.5 (GLOVE) ×2 IMPLANT
GLOVE BIO SURGEONS STRL SZ 6.5 (GLOVE) ×1
GLOVE BIOGEL PI IND STRL 7.0 (GLOVE) ×1 IMPLANT
GLOVE BIOGEL PI IND STRL 8 (GLOVE) ×1 IMPLANT
GLOVE BIOGEL PI INDICATOR 7.0 (GLOVE) ×2
GLOVE BIOGEL PI INDICATOR 8 (GLOVE) ×2
GLOVE ECLIPSE 7.5 STRL STRAW (GLOVE) ×3 IMPLANT
GLOVE SURG SS PI 7.5 STRL IVOR (GLOVE) ×3 IMPLANT
GOWN STRL REUS W/ TWL LRG LVL3 (GOWN DISPOSABLE) ×2 IMPLANT
GOWN STRL REUS W/TWL LRG LVL3 (GOWN DISPOSABLE) ×4
KIT BASIN OR (CUSTOM PROCEDURE TRAY) ×3 IMPLANT
KIT ROOM TURNOVER OR (KITS) ×3 IMPLANT
LIQUID BAND (GAUZE/BANDAGES/DRESSINGS) ×3 IMPLANT
MESH 3DMAX 4X6 RT LRG (Mesh General) ×3 IMPLANT
NS IRRIG 1000ML POUR BTL (IV SOLUTION) ×3 IMPLANT
PAD ARMBOARD 7.5X6 YLW CONV (MISCELLANEOUS) ×6 IMPLANT
PENCIL BUTTON HOLSTER BLD 10FT (ELECTRODE) ×3 IMPLANT
SET IRRIG TUBING LAPAROSCOPIC (IRRIGATION / IRRIGATOR) IMPLANT
SET TROCAR LAP APPLE-HUNT 5MM (ENDOMECHANICALS) ×6 IMPLANT
SUT MNCRL AB 4-0 PS2 18 (SUTURE) ×3 IMPLANT
TOWEL OR 17X24 6PK STRL BLUE (TOWEL DISPOSABLE) ×3 IMPLANT
TOWEL OR 17X26 10 PK STRL BLUE (TOWEL DISPOSABLE) ×3 IMPLANT
TRAY FOLEY CATH 14FRSI W/METER (CATHETERS) ×3 IMPLANT
TRAY LAPAROSCOPIC MC (CUSTOM PROCEDURE TRAY) ×3 IMPLANT
TUBING INSUFFLATION (TUBING) ×3 IMPLANT
WATER STERILE IRR 1000ML POUR (IV SOLUTION) IMPLANT

## 2015-05-05 NOTE — Transfer of Care (Signed)
Immediate Anesthesia Transfer of Care Note  Patient: Dylan Parsons  Procedure(s) Performed: Procedure(s): LAPAROSCOPIC RIGHT  INGUINAL HERNIA REPAIR (Right)  Patient Location: PACU  Anesthesia Type:General  Level of Consciousness: awake, alert , oriented and patient cooperative  Airway & Oxygen Therapy: Patient Spontanous Breathing and Patient connected to face mask oxygen  Post-op Assessment: Report given to RN, Post -op Vital signs reviewed and stable and Patient moving all extremities X 4  Post vital signs: Reviewed and stable  Last Vitals:  Filed Vitals:   05/05/15 0611  BP: 125/77  Pulse: 66  Temp: 36.5 C  Resp: 18    Complications: No apparent anesthesia complications

## 2015-05-05 NOTE — Op Note (Signed)
OPERATIVE REPORT  DATE OF OPERATION: 05/05/2015  PATIENT:  Dylan Parsons  29 y.o. male  PRE-OPERATIVE DIAGNOSIS:  Symptomatic right Inguinal Hernia  POST-OPERATIVE DIAGNOSIS:  Symptomatic right Inguinal Hernia  PROCEDURE:  Procedure(s): LAPAROSCOPIC RIGHT  INGUINAL HERNIA REPAIR WITH MESH  SURGEON:  Surgeon(s): Jimmye NormanJames Kristi Norment, MD  ASSISTANT: None  ANESTHESIA:   general  EBL: <30 ml  BLOOD ADMINISTERED: none  DRAINS: none   SPECIMEN:  No Specimen  COUNTS CORRECT:  YES  PROCEDURE DETAILS: The patient was taken to the operating room and placed on the table in supine position. After an adequate general endotracheal anesthetic was administered he was prepped and draped in usual sterile manner exposing the entire abdomen.  A proper timeout was performed identifying the patient and the procedure to be performed. A left paramedian transverse incision was made approximately 2 cm above the umbilicus on the right side down to the anterior rectus sheath. We dissected down to the posterior sheath where we subsequently passed a dissecting preperitoneal balloon into the preperitoneal space down by the pubic crest. We insufflated the balloon with the bulb attached to the kidney dissecting out the preperitoneal space. We then removed the dissecting balloon then insufflated carbon dioxide gas into the preperitoneal space up to a maximal pressure of 15 mmHg.  Upon passing the laparoscope with attached camera and light source and preperitoneal space was subsequently passed through the 5 mm cannulas in the midline down to the pubic crest. Normally we passed table the first superior 5 mm cannula did not get adequate passage into the preperitoneal space and was caught up in scar tissue from a previous laparoscopic appendectomy. Once all cannulas were in place the patient was placed in Trendelenburg and the left side was tilted down.  We dissected out spermatic cord on right side with its attached indirect  sac. We. Indirect sac away from the spermatic cord and passed down to the peritoneum. We subsequently dissected out the direct hernia sac and then used a large piece of Bard 3-D laparoscopic mesh and tacked in place using the secure strap tacker.  We irrigated with saline solution clearing on space and found there to be no evidence of any nifty bleeding. No tacks were passed lateral to the inferior epigastric vessels down towards the neuroplexus.  We subsequently aspirated all gas from the preperitoneal space and visualize the position of the mesh to the peritoneal space.  We removed all cannulas. The fascia on the anterior sheath was closed using a figure-of-eight stitch of 0 Vicryl suture. 0.25% Marcaine with epinephrine was injected at all sites. The skin at the transverse rectus site was closed using running subcuticular stitch of 4-0 Monocryl. All of the sites were closed with Dermabond Steri-Strips and Tegaderm. All needle counts, sponge counts, and instrument counts were correct. His Foley catheter was removed postoperatively.Marland Kitchen.  PATIENT DISPOSITION:  PACU - hemodynamically stable.   Ottavio Norem 10/14/20169:25 AM

## 2015-05-05 NOTE — H&P (Signed)
Dylan Parsons 04/11/2015 8:47 AM Location: [facility] Patient #: [id number] DOB: 10-29-1985 Single / Language: [place] / Race: White Male   The patient is a 29 year old male    Other Problems [name](Ashley Beck, CMA; 04/11/2015 8:47 AM) Hemorrhoids High blood pressure Inguinal Hernia  Past Surgical History [name](Ashley Beck, CMA; 04/11/2015 8:47 AM) Appendectomy  Diagnostic Studies History [name](Ashley Beck, CMA; 04/11/2015 8:47 AM) Colonoscopy never  Allergies [name](Ashley Beck, CMA; 04/11/2015 8:47 AM) No Known Drug Allergies09/20/2016  Medication History [name](Ashley Beck, CMA; 04/11/2015 8:48 AM) Calcium Carbonate Active. Multiple Vitamin (Oral) Active. Medications Reconciled  Social History [name](Ashley Beck, [place]; 04/11/2015 8:47 AM) Alcohol use Occasional alcohol use. Caffeine use Coffee, Tea. No drug use Tobacco use Never smoker.  Family History [name](Ashley Beck, [place]; 04/11/2015 8:47 AM) Depression Father. Hypertension Father.  Review of Systems [name](Ashley Beck CMA; 04/11/2015 8:47 AM) General Not Present- Appetite Loss, Chills, Fatigue, Fever, Night Sweats, Weight Gain and Weight Loss. Skin Not Present- Change in Wart/Mole, Dryness, Hives, Jaundice, New Lesions, Non-Healing Wounds, Rash and Ulcer. HEENT Not Present- Earache, Hearing Loss, Hoarseness, Nose Bleed, Oral Ulcers, Ringing in the Ears, Seasonal Allergies, Sinus Pain, Sore Throat, Visual Disturbances, Wears glasses/contact lenses and Yellow Eyes. Respiratory Not Present- Bloody sputum, Chronic Cough, Difficulty Breathing, Snoring and Wheezing. Breast Not Present- Breast Mass, Breast Pain, Nipple Discharge and Skin Changes. Cardiovascular Not Present- Chest Pain, Difficulty Breathing Lying Down, Leg Cramps, Palpitations, Rapid Heart Rate, Shortness of Breath and Swelling of Extremities. Gastrointestinal Not Present- Abdominal Pain, Bloating, Bloody Stool, Change in Bowel Habits, Chronic diarrhea, Constipation, Difficulty  Swallowing, Excessive gas, Gets full quickly at meals, Hemorrhoids, Indigestion, Nausea, Rectal Pain and Vomiting. Male Genitourinary Not Present- Blood in Urine, Change in Urinary Stream, Frequency, Impotence, Nocturia, Painful Urination, Urgency and Urine Leakage. Musculoskeletal Not Present- Back Pain, Joint Pain, Joint Stiffness, Muscle Pain, Muscle Weakness and Swelling of Extremities. Neurological Not Present- Decreased Memory, Fainting, Headaches, Numbness, Seizures, Tingling, Tremor, Trouble walking and Weakness. Psychiatric Not Present- Anxiety, Bipolar, Change in Sleep Pattern, Depression, Fearful and Frequent crying. Endocrine Not Present- Cold Intolerance, Excessive Hunger, Hair Changes, Heat Intolerance, Hot flashes and New Diabetes. Hematology Not Present- Easy Bruising, Excessive bleeding, Gland problems, HIV and Persistent Infections.   Vitals [name](Ashley Beck CMA; 04/11/2015 8:48 AM) 04/11/2015 8:48 AM Weight: 171 lb Height: 72in Body Surface Area: 1.99 m Body Mass Index: 23.19 kg/m Temp.: 97.48F(Temporal)  Pulse: 63 (Regular)  BP: 130/70 (Sitting, Left Arm, Standard)    Physical Exam (Jackqulyn Mendel O. Luian Schumpert MD; 04/11/2015 8:58 AM) Chest and Lung Exam Chest and lung exam reveals -normal excursion with symmetric chest walls, quiet, even and easy respiratory effort with no use of accessory muscles, non-tender and normal tactile fremitus and on auscultation, normal breath sounds, no adventitious sounds and normal vocal resonance.  Cardiovascular Cardiovascular examination reveals -on palpation PMI is normal in location and amplitude, no palpable S3 or S4. Normal cardiac borders. and normal heart sounds, regular rate and rhythm with no murmurs.  Abdomen Inspection Hernias - Inguinal hernia - Right - Reducible(Right inguinal hernia, easily reducible.).    Assessment & Plan Fayrene Fearing(Rayder Sullenger O. Saliah Crisp MD; 04/11/2015 9:00 AM) RIGHT INGUINAL HERNIA (K40.90) Impression: Smal likely  indirect RIH. Minimally symptomatic. Not incarcerated. Needs rrepair. Current Plans    The anatomy & physiology of the abdominal wall and pelvic floor was discussed. The pathophysiology of hernias in the inguinal and pelvic region was discussed. Natural history risks such as progressive enlargement, pain, incarceration, and strangulation was  discussed. Contributors to complications such as smoking, obesity, diabetes, prior surgery, etc were discussed.  I feel the risks of no intervention will lead to serious problems that outweigh the operative risks; therefore, I recommended surgery to reduce and repair the hernia. I explained laparoscopic techniques with possible need for an open approach. I noted usual use of mesh to patch and/or buttress hernia repair  Risks such as bleeding, infection, abscess, need for further treatment, heart attack, death, and other risks were discussed. I noted a good likelihood this will help address the problem. Goals of post-operative recovery were discussed as well. Possibility that this will not correct all symptoms was explained. I stressed the importance of low-impact activity, aggressive pain control, avoiding constipation, & not pushing through pain to minimize risk of post-operative chronic pain or injury. Possibility of reherniation was discussed. We will work to minimize complications.  An educational handout further explaining the pathology & treatment options was given as well. Questions were answered. The patient expresses understanding & wishes to proceed with surgery. Pt Education - Overview of treatment for inguinal and femoral hernia in adults: discussed with patient and provided information.  Marta Lamas. Gae Bon, MD, FACS [phone] [facility]

## 2015-05-05 NOTE — Anesthesia Postprocedure Evaluation (Signed)
  Anesthesia Post-op Note  Patient: Dylan Parsons  Procedure(s) Performed: Procedure(s): LAPAROSCOPIC RIGHT  INGUINAL HERNIA REPAIR (Right)  Patient Location: PACU  Anesthesia Type:General  Level of Consciousness: awake, alert  and oriented  Airway and Oxygen Therapy: Patient Spontanous Breathing  Post-op Pain: mild  Post-op Assessment: Post-op Vital signs reviewed and Patient's Cardiovascular Status Stable              Post-op Vital Signs: Reviewed and stable  Last Vitals:  Filed Vitals:   05/05/15 1017  BP: 141/96  Pulse: 68  Temp: 36.4 C  Resp:     Complications: No apparent anesthesia complications

## 2015-05-05 NOTE — Anesthesia Preprocedure Evaluation (Addendum)
Anesthesia Evaluation  Patient identified by MRN, date of birth, ID band Patient awake    Reviewed: Allergy & Precautions, NPO status , Patient's Chart, lab work & pertinent test results  Airway Mallampati: I  TM Distance: >3 FB Neck ROM: Full    Dental  (+) Teeth Intact   Pulmonary neg pulmonary ROS,    breath sounds clear to auscultation       Cardiovascular negative cardio ROS   Rhythm:Regular Rate:Normal     Neuro/Psych PSYCHIATRIC DISORDERS Depression negative neurological ROS     GI/Hepatic negative GI ROS, Neg liver ROS,   Endo/Other  negative endocrine ROS  Renal/GU negative Renal ROS  negative genitourinary   Musculoskeletal negative musculoskeletal ROS (+)   Abdominal   Peds negative pediatric ROS (+)  Hematology negative hematology ROS (+)   Anesthesia Other Findings   Reproductive/Obstetrics negative OB ROS                            Lab Results  Component Value Date   WBC 6.0 05/03/2015   HGB 16.7 05/03/2015   HCT 46.7 05/03/2015   MCV 87.0 05/03/2015   PLT 161 05/03/2015     Anesthesia Physical Anesthesia Plan  ASA: II  Anesthesia Plan: General   Post-op Pain Management:    Induction: Intravenous  Airway Management Planned: Oral ETT  Additional Equipment:   Intra-op Plan:   Post-operative Plan: Extubation in OR  Informed Consent: I have reviewed the patients History and Physical, chart, labs and discussed the procedure including the risks, benefits and alternatives for the proposed anesthesia with the patient or authorized representative who has indicated his/her understanding and acceptance.   Dental advisory given  Plan Discussed with: CRNA  Anesthesia Plan Comments: (Mr. Manson PasseyBrown was consented for post op TAP block if necessary. He would like to see how things go first. )       Anesthesia Quick Evaluation

## 2015-05-05 NOTE — Anesthesia Procedure Notes (Signed)
Procedure Name: Intubation Date/Time: 05/05/2015 8:08 AM Performed by: Bishop LimboARTER, Ji Fairburn S Pre-anesthesia Checklist: Patient identified, Timeout performed, Emergency Drugs available, Suction available and Patient being monitored Patient Re-evaluated:Patient Re-evaluated prior to inductionOxygen Delivery Method: Circle system utilized Preoxygenation: Pre-oxygenation with 100% oxygen Intubation Type: IV induction Ventilation: Mask ventilation without difficulty Laryngoscope Size: Mac and 4 Grade View: Grade I Tube type: Oral Tube size: 7.5 mm Number of attempts: 1 Airway Equipment and Method: Stylet Placement Confirmation: ETT inserted through vocal cords under direct vision,  breath sounds checked- equal and bilateral,  positive ETCO2 and CO2 detector Secured at: 22 cm Tube secured with: Tape Dental Injury: Teeth and Oropharynx as per pre-operative assessment

## 2015-05-05 NOTE — Discharge Instructions (Signed)
Open Hernia Repair, Care After Refer to this sheet in the next few weeks. These instructions provide you with information on caring for yourself after your procedure. Your health care provider may also give you more specific instructions. Your treatment has been planned according to current medical practices, but problems sometimes occur. Call your health care provider if you have any problems or questions after your procedure. WHAT TO EXPECT AFTER THE PROCEDURE After your procedure, it is typical to have the following:  Pain in your abdomen, especially along your incision. You will be given pain medicines to control the pain.  Constipation. You may be given a stool softener to help prevent this. HOME CARE INSTRUCTIONS  Only take over-the-counter or prescription medicines as directed by your health care provider.  Keep the incision area dry and clean. You may wash the incision area gently with soap and water 48 hours after surgery. Gently blot or dab the incision area dry. Do not take baths, use swimming pools, or use hot tubs for 10 days or until your health care provider approves.  Change bandages (dressings) as directed by your health care provider.  Continue your normal diet as directed by your health care provider. Eat plenty of fruits and vegetables to help prevent constipation.  Drink enough fluids to keep your urine clear or pale yellow. This also helps prevent constipation.  Do not drive until your health care provider says it is okay.  Do not lift anything heavier than 10 lb (4.5 kg) or play contact sports for 4 weeks or until your health care provider approves.  Follow up with your health care provider as directed. Ask your health care provider when to make an appointment to have your stitches (sutures) or staples removed.  You may notice some bruising in the right scrotum or in the penis.  As long as this is not severely painful or tense, it will resolve without problems. SEEK  MEDICAL CARE IF:  You have increased bleeding coming from the incision site.  You have blood in your stool.  You have increasing pain in the incision area.  You see redness or swelling in the incision area.  You have fluid (pus) coming from the incision.  You have a fever.  You notice a bad smell coming from the incision area or dressing. SEEK IMMEDIATE MEDICAL CARE IF:  You develop a rash.  You have chest pain or shortness of breath.  You feel lightheaded or feel faint.   This information is not intended to replace advice given to you by your health care provider. Make sure you discuss any questions you have with your health care provider.   Document Released: 01/25/2005 Document Revised: 07/29/2014 Document Reviewed: 02/17/2013 Elsevier Interactive Patient Education [employer]

## 2015-05-05 NOTE — Progress Notes (Signed)
0.5 mg of dilaudid was wasted by myself and Diane RN after patient left. Placed in sharps

## 2015-05-08 ENCOUNTER — Encounter (HOSPITAL_COMMUNITY): Payer: Self-pay | Admitting: General Surgery

## 2015-06-04 IMAGING — CT CT ABD-PELV W/ CM
2 of 4 series · 16 of 46 positions shown, 18 images · IV contrast (Omni 300)
Comparison: None.

CLINICAL DATA: Mid abdominal pain

EXAM:
CT ABDOMEN AND PELVIS WITH CONTRAST
TECHNIQUE: Multidetector CT imaging of the abdomen and pelvis was performed
using the standard protocol following bolus administration of
intravenous contrast.
CONTRAST:  100mL OMNIPAQUE IOHEXOL 300 MG/ML SOLN, 25mL OMNIPAQUE
IOHEXOL 300 MG/ML SOLN

[Series 2: abd/ pelvis 5.0 i30f 1 · axial · 0.70mm/px · z∈[+811,+1246]mm · 13 of 95 slices shown, 15 images]
[im 4/95  soft-tissue]
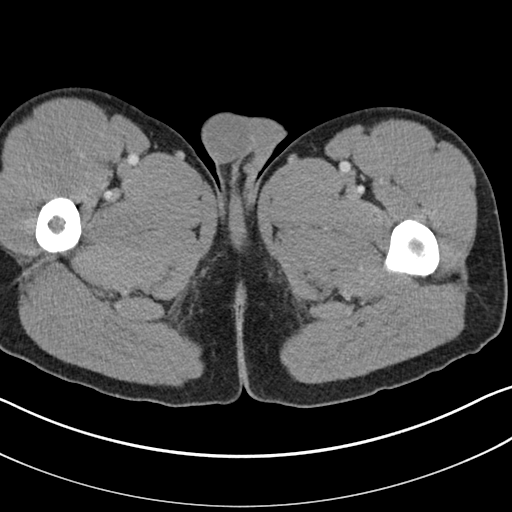
[im 4/95  bone]
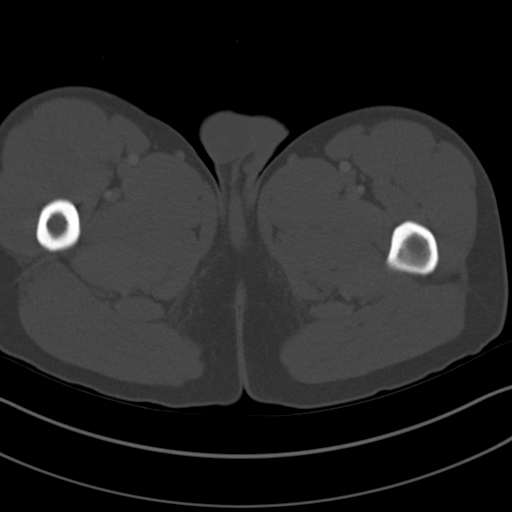
[im 12/95  soft-tissue]
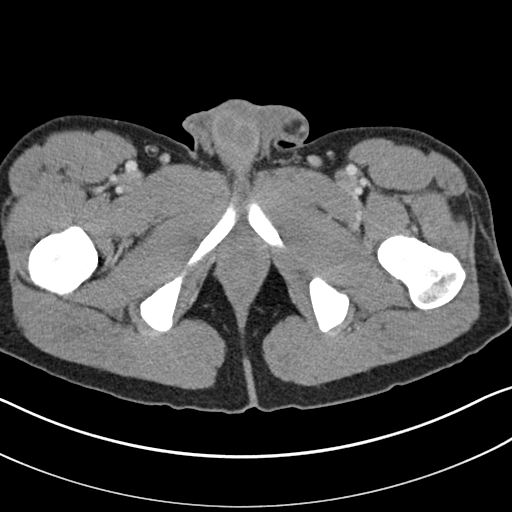
[im 19/95  soft-tissue]
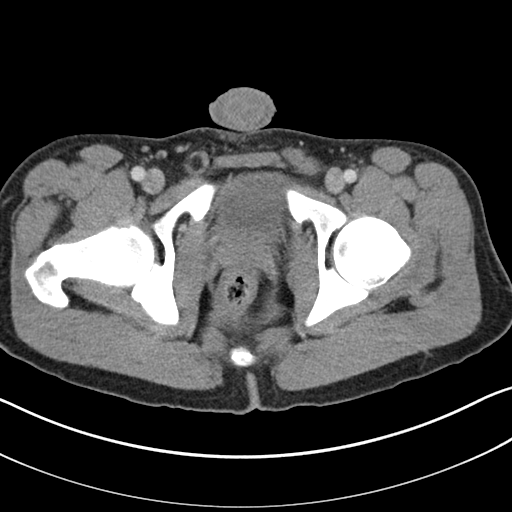
[im 27/95  soft-tissue]
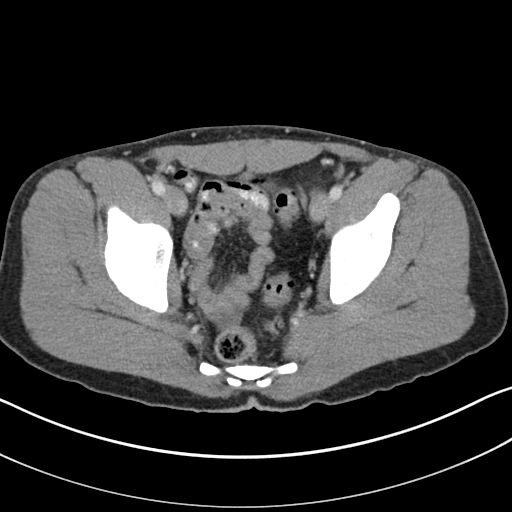
[im 34/95  soft-tissue]
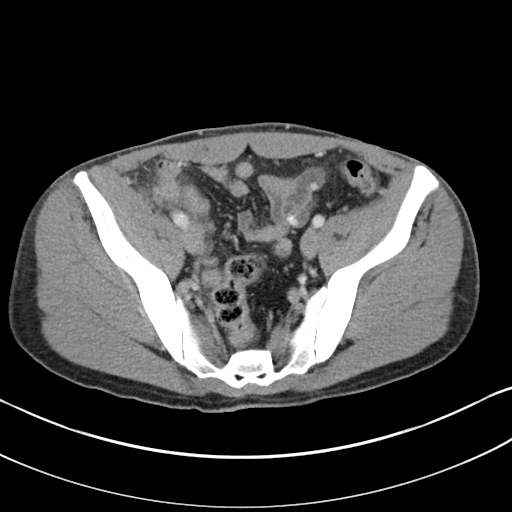
[im 42/95  soft-tissue]
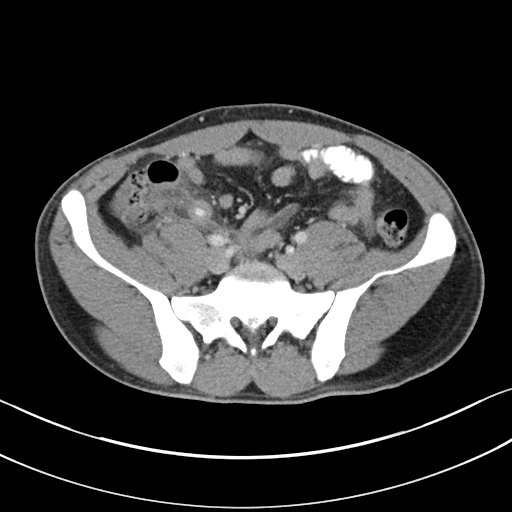
[im 49/95  soft-tissue]
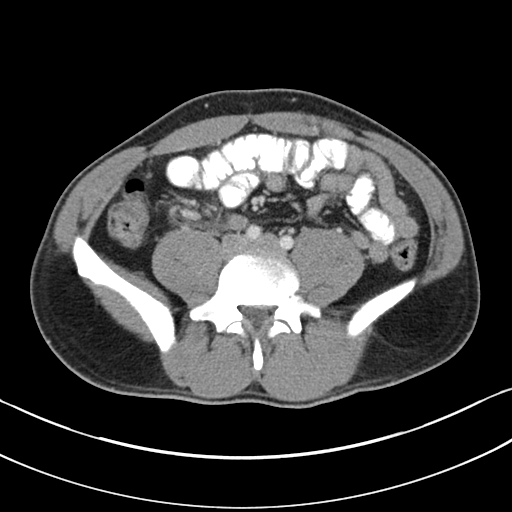
[im 53/95  soft-tissue]
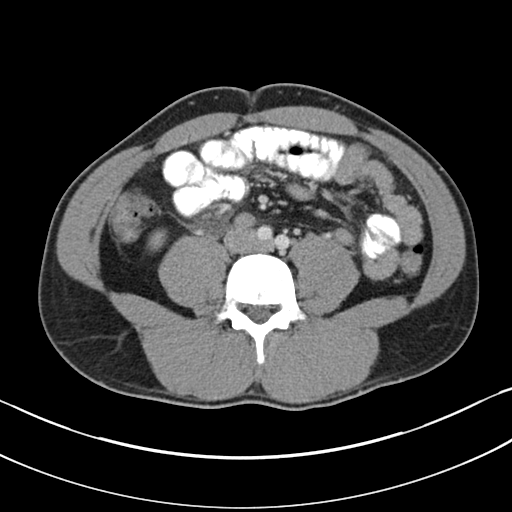
[im 61/95  soft-tissue]
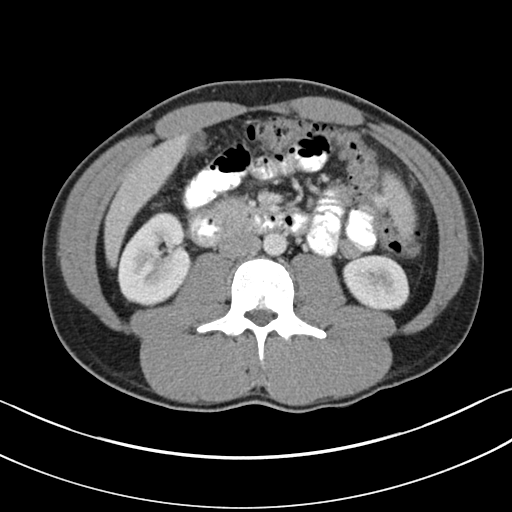
[im 61/95  bone]
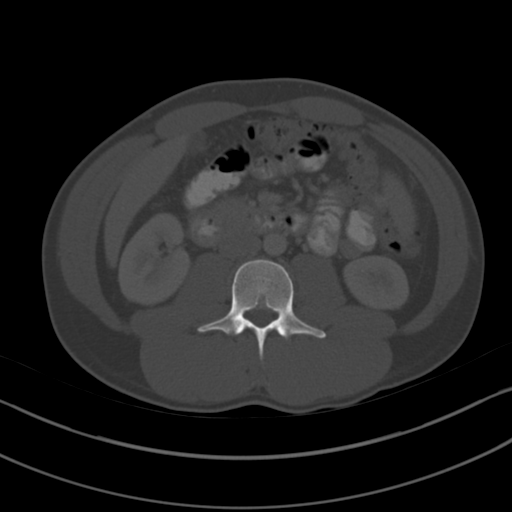
[im 68/95  soft-tissue]
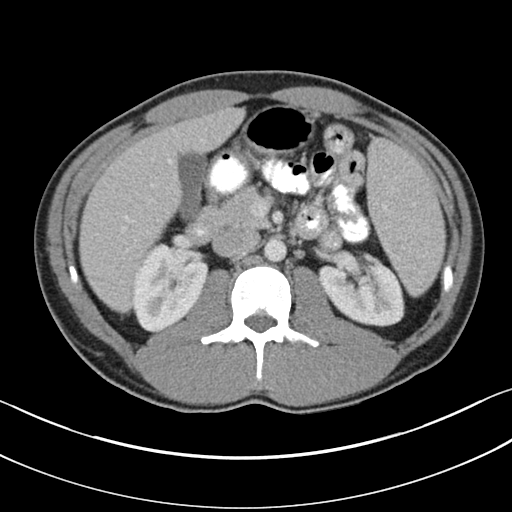
[im 76/95  soft-tissue]
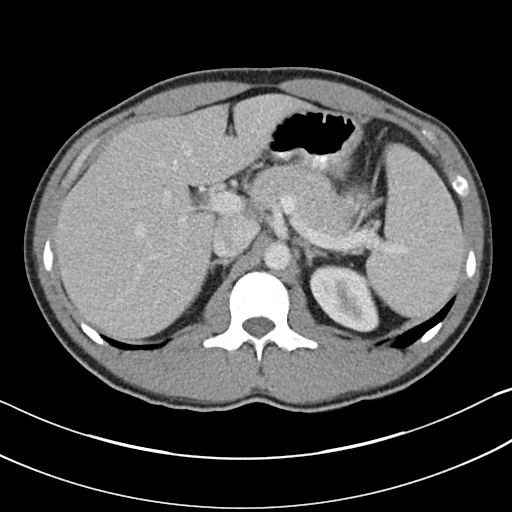
[im 83/95  soft-tissue]
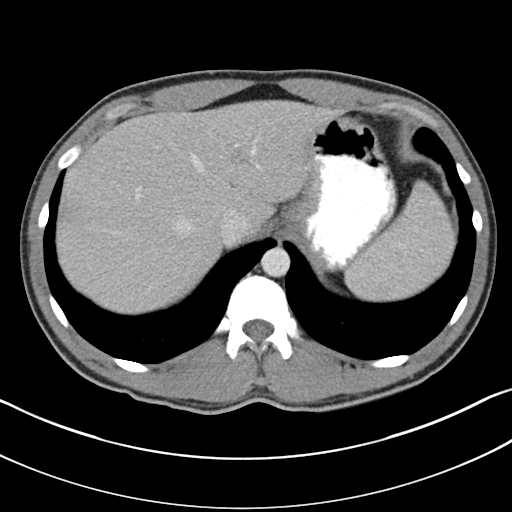
[im 91/95  soft-tissue]
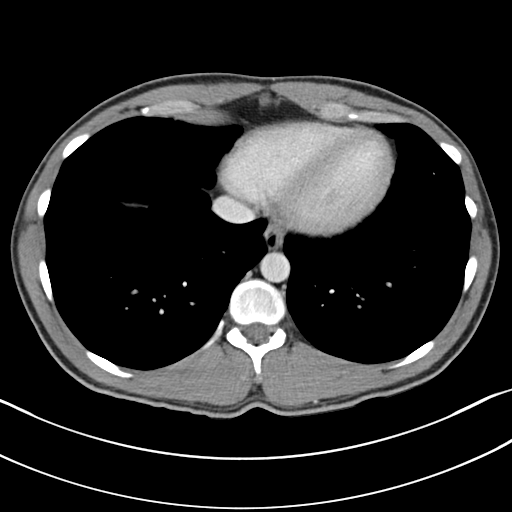

[Series 5: coronals · coronal · 0.70mm/px · 3 of 135 slices shown]
[im 45/135  soft-tissue]
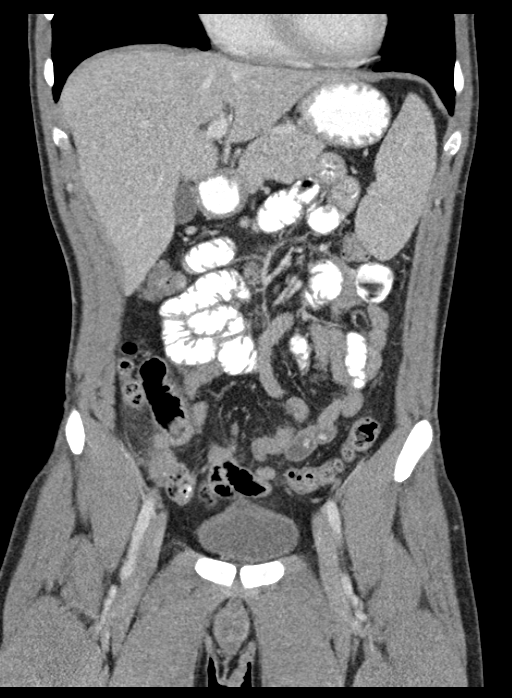
[im 60/135  soft-tissue]
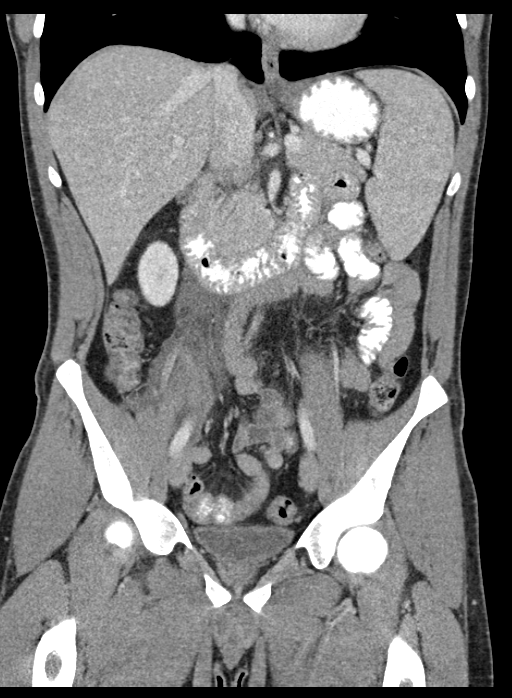
[im 75/135  soft-tissue]
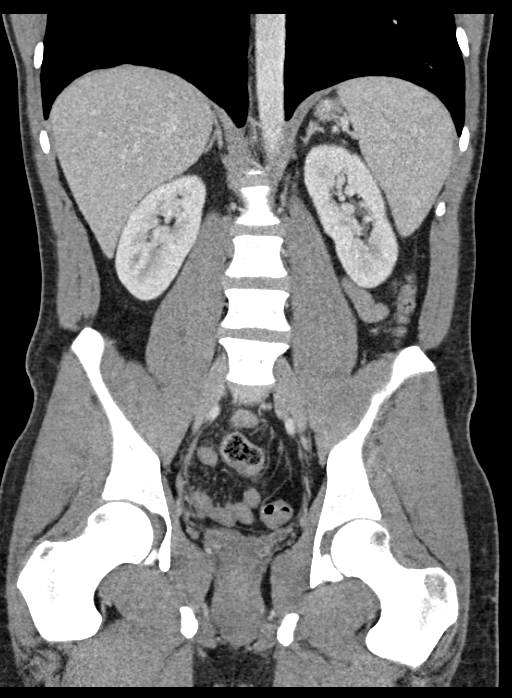

[16 of 46 positions shown; findings below may reference images not displayed]

FINDINGS: The lung bases are free of acute infiltrate or sizable effusion.

The liver, gallbladder, spleen, adrenal glands and pancreas are all
normal in their CT appearance. Kidneys are well visualized
bilaterally. No renal calculi or urinary tract obstructive changes
are seen.

In the right lower quadrant there is considerable inflammatory
change surrounding the dilated appendix. Appendicolith is noted
within the appendix. It is maximally dilated to 2 cm. No free pelvic
fluid is noted. The bladder is well distended. No acute bony
abnormality is seen.
IMPRESSION: Changes consistent with acute appendicitis
